# Patient Record
Sex: Male | Born: 1935 | Race: White | Hispanic: No | Marital: Married | Smoking: Never smoker
Health system: Southern US, Community
[De-identification: ages and names within clinical notes are randomized; demographics above are authoritative.]

## PROBLEM LIST (undated history)

## (undated) DIAGNOSIS — H9190 Unspecified hearing loss, unspecified ear: Secondary | ICD-10-CM

## (undated) DIAGNOSIS — G629 Polyneuropathy, unspecified: Secondary | ICD-10-CM

## (undated) HISTORY — PX: NECK SURGERY: SHX720

---

## 2011-11-03 DIAGNOSIS — M999 Biomechanical lesion, unspecified: Secondary | ICD-10-CM | POA: Diagnosis not present

## 2011-11-03 DIAGNOSIS — M9981 Other biomechanical lesions of cervical region: Secondary | ICD-10-CM | POA: Diagnosis not present

## 2011-11-03 DIAGNOSIS — M5030 Other cervical disc degeneration, unspecified cervical region: Secondary | ICD-10-CM | POA: Diagnosis not present

## 2011-11-03 DIAGNOSIS — M5137 Other intervertebral disc degeneration, lumbosacral region: Secondary | ICD-10-CM | POA: Diagnosis not present

## 2011-12-01 DIAGNOSIS — H25099 Other age-related incipient cataract, unspecified eye: Secondary | ICD-10-CM | POA: Diagnosis not present

## 2011-12-01 DIAGNOSIS — H1130 Conjunctival hemorrhage, unspecified eye: Secondary | ICD-10-CM | POA: Diagnosis not present

## 2011-12-01 DIAGNOSIS — H33309 Unspecified retinal break, unspecified eye: Secondary | ICD-10-CM | POA: Diagnosis not present

## 2011-12-01 DIAGNOSIS — H02409 Unspecified ptosis of unspecified eyelid: Secondary | ICD-10-CM | POA: Diagnosis not present

## 2011-12-08 DIAGNOSIS — M5137 Other intervertebral disc degeneration, lumbosacral region: Secondary | ICD-10-CM | POA: Diagnosis not present

## 2011-12-08 DIAGNOSIS — M5030 Other cervical disc degeneration, unspecified cervical region: Secondary | ICD-10-CM | POA: Diagnosis not present

## 2011-12-08 DIAGNOSIS — M999 Biomechanical lesion, unspecified: Secondary | ICD-10-CM | POA: Diagnosis not present

## 2011-12-08 DIAGNOSIS — M9981 Other biomechanical lesions of cervical region: Secondary | ICD-10-CM | POA: Diagnosis not present

## 2011-12-23 DIAGNOSIS — H35039 Hypertensive retinopathy, unspecified eye: Secondary | ICD-10-CM | POA: Diagnosis not present

## 2011-12-23 DIAGNOSIS — I10 Essential (primary) hypertension: Secondary | ICD-10-CM | POA: Diagnosis not present

## 2012-01-05 DIAGNOSIS — M999 Biomechanical lesion, unspecified: Secondary | ICD-10-CM | POA: Diagnosis not present

## 2012-01-05 DIAGNOSIS — M5030 Other cervical disc degeneration, unspecified cervical region: Secondary | ICD-10-CM | POA: Diagnosis not present

## 2012-01-05 DIAGNOSIS — M5137 Other intervertebral disc degeneration, lumbosacral region: Secondary | ICD-10-CM | POA: Diagnosis not present

## 2012-01-05 DIAGNOSIS — M9981 Other biomechanical lesions of cervical region: Secondary | ICD-10-CM | POA: Diagnosis not present

## 2012-01-10 DIAGNOSIS — J019 Acute sinusitis, unspecified: Secondary | ICD-10-CM | POA: Diagnosis not present

## 2012-02-02 DIAGNOSIS — M5137 Other intervertebral disc degeneration, lumbosacral region: Secondary | ICD-10-CM | POA: Diagnosis not present

## 2012-02-02 DIAGNOSIS — M999 Biomechanical lesion, unspecified: Secondary | ICD-10-CM | POA: Diagnosis not present

## 2012-02-02 DIAGNOSIS — M5030 Other cervical disc degeneration, unspecified cervical region: Secondary | ICD-10-CM | POA: Diagnosis not present

## 2012-02-02 DIAGNOSIS — M9981 Other biomechanical lesions of cervical region: Secondary | ICD-10-CM | POA: Diagnosis not present

## 2012-03-01 DIAGNOSIS — M999 Biomechanical lesion, unspecified: Secondary | ICD-10-CM | POA: Diagnosis not present

## 2012-03-01 DIAGNOSIS — M5137 Other intervertebral disc degeneration, lumbosacral region: Secondary | ICD-10-CM | POA: Diagnosis not present

## 2012-03-01 DIAGNOSIS — M9981 Other biomechanical lesions of cervical region: Secondary | ICD-10-CM | POA: Diagnosis not present

## 2012-03-01 DIAGNOSIS — M5030 Other cervical disc degeneration, unspecified cervical region: Secondary | ICD-10-CM | POA: Diagnosis not present

## 2012-03-07 DIAGNOSIS — M5030 Other cervical disc degeneration, unspecified cervical region: Secondary | ICD-10-CM | POA: Diagnosis not present

## 2012-03-07 DIAGNOSIS — M9981 Other biomechanical lesions of cervical region: Secondary | ICD-10-CM | POA: Diagnosis not present

## 2012-03-07 DIAGNOSIS — M999 Biomechanical lesion, unspecified: Secondary | ICD-10-CM | POA: Diagnosis not present

## 2012-03-07 DIAGNOSIS — M5137 Other intervertebral disc degeneration, lumbosacral region: Secondary | ICD-10-CM | POA: Diagnosis not present

## 2012-03-08 DIAGNOSIS — M999 Biomechanical lesion, unspecified: Secondary | ICD-10-CM | POA: Diagnosis not present

## 2012-03-08 DIAGNOSIS — M9981 Other biomechanical lesions of cervical region: Secondary | ICD-10-CM | POA: Diagnosis not present

## 2012-03-08 DIAGNOSIS — M5137 Other intervertebral disc degeneration, lumbosacral region: Secondary | ICD-10-CM | POA: Diagnosis not present

## 2012-03-08 DIAGNOSIS — M5030 Other cervical disc degeneration, unspecified cervical region: Secondary | ICD-10-CM | POA: Diagnosis not present

## 2012-03-22 DIAGNOSIS — M999 Biomechanical lesion, unspecified: Secondary | ICD-10-CM | POA: Diagnosis not present

## 2012-03-22 DIAGNOSIS — M5030 Other cervical disc degeneration, unspecified cervical region: Secondary | ICD-10-CM | POA: Diagnosis not present

## 2012-03-22 DIAGNOSIS — M5137 Other intervertebral disc degeneration, lumbosacral region: Secondary | ICD-10-CM | POA: Diagnosis not present

## 2012-03-22 DIAGNOSIS — M9981 Other biomechanical lesions of cervical region: Secondary | ICD-10-CM | POA: Diagnosis not present

## 2012-04-04 DIAGNOSIS — G609 Hereditary and idiopathic neuropathy, unspecified: Secondary | ICD-10-CM | POA: Diagnosis not present

## 2012-04-05 DIAGNOSIS — M9981 Other biomechanical lesions of cervical region: Secondary | ICD-10-CM | POA: Diagnosis not present

## 2012-04-05 DIAGNOSIS — M5030 Other cervical disc degeneration, unspecified cervical region: Secondary | ICD-10-CM | POA: Diagnosis not present

## 2012-04-05 DIAGNOSIS — M5137 Other intervertebral disc degeneration, lumbosacral region: Secondary | ICD-10-CM | POA: Diagnosis not present

## 2012-04-05 DIAGNOSIS — M999 Biomechanical lesion, unspecified: Secondary | ICD-10-CM | POA: Diagnosis not present

## 2012-04-11 DIAGNOSIS — J069 Acute upper respiratory infection, unspecified: Secondary | ICD-10-CM | POA: Diagnosis not present

## 2012-04-19 DIAGNOSIS — M5030 Other cervical disc degeneration, unspecified cervical region: Secondary | ICD-10-CM | POA: Diagnosis not present

## 2012-04-19 DIAGNOSIS — M9981 Other biomechanical lesions of cervical region: Secondary | ICD-10-CM | POA: Diagnosis not present

## 2012-04-19 DIAGNOSIS — M999 Biomechanical lesion, unspecified: Secondary | ICD-10-CM | POA: Diagnosis not present

## 2012-04-19 DIAGNOSIS — M5137 Other intervertebral disc degeneration, lumbosacral region: Secondary | ICD-10-CM | POA: Diagnosis not present

## 2012-05-10 DIAGNOSIS — G608 Other hereditary and idiopathic neuropathies: Secondary | ICD-10-CM | POA: Diagnosis not present

## 2012-05-14 DIAGNOSIS — Z79899 Other long term (current) drug therapy: Secondary | ICD-10-CM | POA: Diagnosis not present

## 2012-05-17 DIAGNOSIS — M9981 Other biomechanical lesions of cervical region: Secondary | ICD-10-CM | POA: Diagnosis not present

## 2012-05-17 DIAGNOSIS — M999 Biomechanical lesion, unspecified: Secondary | ICD-10-CM | POA: Diagnosis not present

## 2012-05-17 DIAGNOSIS — M5030 Other cervical disc degeneration, unspecified cervical region: Secondary | ICD-10-CM | POA: Diagnosis not present

## 2012-05-17 DIAGNOSIS — M5137 Other intervertebral disc degeneration, lumbosacral region: Secondary | ICD-10-CM | POA: Diagnosis not present

## 2012-05-18 DIAGNOSIS — T6391XA Toxic effect of contact with unspecified venomous animal, accidental (unintentional), initial encounter: Secondary | ICD-10-CM | POA: Diagnosis not present

## 2012-06-14 DIAGNOSIS — M999 Biomechanical lesion, unspecified: Secondary | ICD-10-CM | POA: Diagnosis not present

## 2012-06-14 DIAGNOSIS — M9981 Other biomechanical lesions of cervical region: Secondary | ICD-10-CM | POA: Diagnosis not present

## 2012-06-14 DIAGNOSIS — M5137 Other intervertebral disc degeneration, lumbosacral region: Secondary | ICD-10-CM | POA: Diagnosis not present

## 2012-06-14 DIAGNOSIS — M5030 Other cervical disc degeneration, unspecified cervical region: Secondary | ICD-10-CM | POA: Diagnosis not present

## 2012-06-18 DIAGNOSIS — G589 Mononeuropathy, unspecified: Secondary | ICD-10-CM | POA: Diagnosis not present

## 2012-06-27 DIAGNOSIS — G608 Other hereditary and idiopathic neuropathies: Secondary | ICD-10-CM | POA: Diagnosis not present

## 2012-07-10 DIAGNOSIS — G609 Hereditary and idiopathic neuropathy, unspecified: Secondary | ICD-10-CM | POA: Diagnosis not present

## 2012-07-12 DIAGNOSIS — M5030 Other cervical disc degeneration, unspecified cervical region: Secondary | ICD-10-CM | POA: Diagnosis not present

## 2012-07-12 DIAGNOSIS — M999 Biomechanical lesion, unspecified: Secondary | ICD-10-CM | POA: Diagnosis not present

## 2012-07-12 DIAGNOSIS — M5137 Other intervertebral disc degeneration, lumbosacral region: Secondary | ICD-10-CM | POA: Diagnosis not present

## 2012-07-12 DIAGNOSIS — M9981 Other biomechanical lesions of cervical region: Secondary | ICD-10-CM | POA: Diagnosis not present

## 2012-08-09 DIAGNOSIS — M5030 Other cervical disc degeneration, unspecified cervical region: Secondary | ICD-10-CM | POA: Diagnosis not present

## 2012-08-09 DIAGNOSIS — M5137 Other intervertebral disc degeneration, lumbosacral region: Secondary | ICD-10-CM | POA: Diagnosis not present

## 2012-08-09 DIAGNOSIS — M999 Biomechanical lesion, unspecified: Secondary | ICD-10-CM | POA: Diagnosis not present

## 2012-08-09 DIAGNOSIS — M9981 Other biomechanical lesions of cervical region: Secondary | ICD-10-CM | POA: Diagnosis not present

## 2012-08-27 DIAGNOSIS — G608 Other hereditary and idiopathic neuropathies: Secondary | ICD-10-CM | POA: Diagnosis not present

## 2012-10-01 DIAGNOSIS — M545 Low back pain: Secondary | ICD-10-CM | POA: Diagnosis not present

## 2012-10-30 DIAGNOSIS — H3560 Retinal hemorrhage, unspecified eye: Secondary | ICD-10-CM | POA: Diagnosis not present

## 2012-10-31 DIAGNOSIS — M47817 Spondylosis without myelopathy or radiculopathy, lumbosacral region: Secondary | ICD-10-CM | POA: Diagnosis not present

## 2012-11-14 DIAGNOSIS — M47817 Spondylosis without myelopathy or radiculopathy, lumbosacral region: Secondary | ICD-10-CM | POA: Diagnosis not present

## 2012-11-16 DIAGNOSIS — S61209A Unspecified open wound of unspecified finger without damage to nail, initial encounter: Secondary | ICD-10-CM | POA: Diagnosis not present

## 2012-11-16 DIAGNOSIS — T148XXA Other injury of unspecified body region, initial encounter: Secondary | ICD-10-CM | POA: Diagnosis not present

## 2013-01-15 DIAGNOSIS — M899 Disorder of bone, unspecified: Secondary | ICD-10-CM | POA: Diagnosis not present

## 2013-01-15 DIAGNOSIS — M949 Disorder of cartilage, unspecified: Secondary | ICD-10-CM | POA: Diagnosis not present

## 2013-01-15 DIAGNOSIS — M7500 Adhesive capsulitis of unspecified shoulder: Secondary | ICD-10-CM | POA: Diagnosis not present

## 2013-01-22 DIAGNOSIS — IMO0001 Reserved for inherently not codable concepts without codable children: Secondary | ICD-10-CM | POA: Diagnosis not present

## 2013-01-22 DIAGNOSIS — M7500 Adhesive capsulitis of unspecified shoulder: Secondary | ICD-10-CM | POA: Diagnosis not present

## 2013-01-28 DIAGNOSIS — Z800 Family history of malignant neoplasm of digestive organs: Secondary | ICD-10-CM | POA: Diagnosis not present

## 2013-01-28 DIAGNOSIS — Z86010 Personal history of colonic polyps: Secondary | ICD-10-CM | POA: Diagnosis not present

## 2013-01-30 DIAGNOSIS — Z79899 Other long term (current) drug therapy: Secondary | ICD-10-CM | POA: Diagnosis not present

## 2013-01-30 DIAGNOSIS — G589 Mononeuropathy, unspecified: Secondary | ICD-10-CM | POA: Diagnosis not present

## 2013-01-30 DIAGNOSIS — M545 Low back pain: Secondary | ICD-10-CM | POA: Diagnosis not present

## 2013-02-21 DIAGNOSIS — M7500 Adhesive capsulitis of unspecified shoulder: Secondary | ICD-10-CM | POA: Diagnosis not present

## 2013-02-21 DIAGNOSIS — IMO0001 Reserved for inherently not codable concepts without codable children: Secondary | ICD-10-CM | POA: Diagnosis not present

## 2013-07-31 DIAGNOSIS — L909 Atrophic disorder of skin, unspecified: Secondary | ICD-10-CM | POA: Diagnosis not present

## 2013-07-31 DIAGNOSIS — L821 Other seborrheic keratosis: Secondary | ICD-10-CM | POA: Diagnosis not present

## 2013-07-31 DIAGNOSIS — L820 Inflamed seborrheic keratosis: Secondary | ICD-10-CM | POA: Diagnosis not present

## 2013-11-21 DIAGNOSIS — R011 Cardiac murmur, unspecified: Secondary | ICD-10-CM | POA: Diagnosis not present

## 2013-11-21 DIAGNOSIS — R259 Unspecified abnormal involuntary movements: Secondary | ICD-10-CM | POA: Diagnosis not present

## 2013-11-21 DIAGNOSIS — G609 Hereditary and idiopathic neuropathy, unspecified: Secondary | ICD-10-CM | POA: Diagnosis not present

## 2013-11-21 DIAGNOSIS — R252 Cramp and spasm: Secondary | ICD-10-CM | POA: Diagnosis not present

## 2013-12-11 ENCOUNTER — Other Ambulatory Visit (HOSPITAL_COMMUNITY): Payer: Self-pay

## 2013-12-11 ENCOUNTER — Encounter (HOSPITAL_COMMUNITY): Payer: Self-pay | Admitting: Geriatric Medicine

## 2013-12-16 DIAGNOSIS — M5126 Other intervertebral disc displacement, lumbar region: Secondary | ICD-10-CM | POA: Diagnosis not present

## 2013-12-16 DIAGNOSIS — IMO0002 Reserved for concepts with insufficient information to code with codable children: Secondary | ICD-10-CM | POA: Diagnosis not present

## 2013-12-16 DIAGNOSIS — M999 Biomechanical lesion, unspecified: Secondary | ICD-10-CM | POA: Diagnosis not present

## 2013-12-16 DIAGNOSIS — G608 Other hereditary and idiopathic neuropathies: Secondary | ICD-10-CM | POA: Diagnosis not present

## 2013-12-16 DIAGNOSIS — M5124 Other intervertebral disc displacement, thoracic region: Secondary | ICD-10-CM | POA: Diagnosis not present

## 2013-12-18 DIAGNOSIS — M999 Biomechanical lesion, unspecified: Secondary | ICD-10-CM | POA: Diagnosis not present

## 2013-12-18 DIAGNOSIS — G608 Other hereditary and idiopathic neuropathies: Secondary | ICD-10-CM | POA: Diagnosis not present

## 2013-12-18 DIAGNOSIS — IMO0002 Reserved for concepts with insufficient information to code with codable children: Secondary | ICD-10-CM | POA: Diagnosis not present

## 2013-12-18 DIAGNOSIS — M5124 Other intervertebral disc displacement, thoracic region: Secondary | ICD-10-CM | POA: Diagnosis not present

## 2013-12-18 DIAGNOSIS — M5126 Other intervertebral disc displacement, lumbar region: Secondary | ICD-10-CM | POA: Diagnosis not present

## 2013-12-20 DIAGNOSIS — M5124 Other intervertebral disc displacement, thoracic region: Secondary | ICD-10-CM | POA: Diagnosis not present

## 2013-12-20 DIAGNOSIS — M5126 Other intervertebral disc displacement, lumbar region: Secondary | ICD-10-CM | POA: Diagnosis not present

## 2013-12-20 DIAGNOSIS — G608 Other hereditary and idiopathic neuropathies: Secondary | ICD-10-CM | POA: Diagnosis not present

## 2013-12-20 DIAGNOSIS — M999 Biomechanical lesion, unspecified: Secondary | ICD-10-CM | POA: Diagnosis not present

## 2013-12-20 DIAGNOSIS — IMO0002 Reserved for concepts with insufficient information to code with codable children: Secondary | ICD-10-CM | POA: Diagnosis not present

## 2013-12-23 DIAGNOSIS — M999 Biomechanical lesion, unspecified: Secondary | ICD-10-CM | POA: Diagnosis not present

## 2013-12-23 DIAGNOSIS — M5124 Other intervertebral disc displacement, thoracic region: Secondary | ICD-10-CM | POA: Diagnosis not present

## 2013-12-23 DIAGNOSIS — M5126 Other intervertebral disc displacement, lumbar region: Secondary | ICD-10-CM | POA: Diagnosis not present

## 2013-12-23 DIAGNOSIS — G608 Other hereditary and idiopathic neuropathies: Secondary | ICD-10-CM | POA: Diagnosis not present

## 2013-12-23 DIAGNOSIS — IMO0002 Reserved for concepts with insufficient information to code with codable children: Secondary | ICD-10-CM | POA: Diagnosis not present

## 2013-12-24 DIAGNOSIS — G608 Other hereditary and idiopathic neuropathies: Secondary | ICD-10-CM | POA: Diagnosis not present

## 2013-12-24 DIAGNOSIS — M5124 Other intervertebral disc displacement, thoracic region: Secondary | ICD-10-CM | POA: Diagnosis not present

## 2013-12-24 DIAGNOSIS — M999 Biomechanical lesion, unspecified: Secondary | ICD-10-CM | POA: Diagnosis not present

## 2013-12-24 DIAGNOSIS — M5126 Other intervertebral disc displacement, lumbar region: Secondary | ICD-10-CM | POA: Diagnosis not present

## 2013-12-24 DIAGNOSIS — IMO0002 Reserved for concepts with insufficient information to code with codable children: Secondary | ICD-10-CM | POA: Diagnosis not present

## 2013-12-25 DIAGNOSIS — M999 Biomechanical lesion, unspecified: Secondary | ICD-10-CM | POA: Diagnosis not present

## 2013-12-25 DIAGNOSIS — IMO0002 Reserved for concepts with insufficient information to code with codable children: Secondary | ICD-10-CM | POA: Diagnosis not present

## 2013-12-25 DIAGNOSIS — G608 Other hereditary and idiopathic neuropathies: Secondary | ICD-10-CM | POA: Diagnosis not present

## 2013-12-25 DIAGNOSIS — M5124 Other intervertebral disc displacement, thoracic region: Secondary | ICD-10-CM | POA: Diagnosis not present

## 2013-12-25 DIAGNOSIS — M5126 Other intervertebral disc displacement, lumbar region: Secondary | ICD-10-CM | POA: Diagnosis not present

## 2013-12-30 ENCOUNTER — Ambulatory Visit (HOSPITAL_COMMUNITY): Payer: Medicare Other | Attending: Cardiology | Admitting: Cardiology

## 2013-12-30 ENCOUNTER — Other Ambulatory Visit (HOSPITAL_COMMUNITY): Payer: Self-pay

## 2013-12-30 ENCOUNTER — Other Ambulatory Visit (HOSPITAL_COMMUNITY): Payer: Self-pay | Admitting: Cardiology

## 2013-12-30 DIAGNOSIS — R011 Cardiac murmur, unspecified: Secondary | ICD-10-CM

## 2013-12-30 DIAGNOSIS — M5124 Other intervertebral disc displacement, thoracic region: Secondary | ICD-10-CM | POA: Diagnosis not present

## 2013-12-30 DIAGNOSIS — I059 Rheumatic mitral valve disease, unspecified: Secondary | ICD-10-CM

## 2013-12-30 DIAGNOSIS — G608 Other hereditary and idiopathic neuropathies: Secondary | ICD-10-CM | POA: Diagnosis not present

## 2013-12-30 DIAGNOSIS — I359 Nonrheumatic aortic valve disorder, unspecified: Secondary | ICD-10-CM | POA: Diagnosis not present

## 2013-12-30 DIAGNOSIS — M5126 Other intervertebral disc displacement, lumbar region: Secondary | ICD-10-CM | POA: Diagnosis not present

## 2013-12-30 DIAGNOSIS — IMO0002 Reserved for concepts with insufficient information to code with codable children: Secondary | ICD-10-CM | POA: Diagnosis not present

## 2013-12-30 DIAGNOSIS — M999 Biomechanical lesion, unspecified: Secondary | ICD-10-CM | POA: Diagnosis not present

## 2013-12-30 NOTE — Progress Notes (Signed)
Echo performed. 

## 2013-12-31 DIAGNOSIS — M5124 Other intervertebral disc displacement, thoracic region: Secondary | ICD-10-CM | POA: Diagnosis not present

## 2013-12-31 DIAGNOSIS — IMO0002 Reserved for concepts with insufficient information to code with codable children: Secondary | ICD-10-CM | POA: Diagnosis not present

## 2013-12-31 DIAGNOSIS — M5126 Other intervertebral disc displacement, lumbar region: Secondary | ICD-10-CM | POA: Diagnosis not present

## 2013-12-31 DIAGNOSIS — M999 Biomechanical lesion, unspecified: Secondary | ICD-10-CM | POA: Diagnosis not present

## 2013-12-31 DIAGNOSIS — G608 Other hereditary and idiopathic neuropathies: Secondary | ICD-10-CM | POA: Diagnosis not present

## 2014-01-02 DIAGNOSIS — M5124 Other intervertebral disc displacement, thoracic region: Secondary | ICD-10-CM | POA: Diagnosis not present

## 2014-01-02 DIAGNOSIS — IMO0002 Reserved for concepts with insufficient information to code with codable children: Secondary | ICD-10-CM | POA: Diagnosis not present

## 2014-01-02 DIAGNOSIS — M999 Biomechanical lesion, unspecified: Secondary | ICD-10-CM | POA: Diagnosis not present

## 2014-01-02 DIAGNOSIS — G608 Other hereditary and idiopathic neuropathies: Secondary | ICD-10-CM | POA: Diagnosis not present

## 2014-01-02 DIAGNOSIS — M5126 Other intervertebral disc displacement, lumbar region: Secondary | ICD-10-CM | POA: Diagnosis not present

## 2014-01-06 DIAGNOSIS — IMO0002 Reserved for concepts with insufficient information to code with codable children: Secondary | ICD-10-CM | POA: Diagnosis not present

## 2014-01-06 DIAGNOSIS — M25559 Pain in unspecified hip: Secondary | ICD-10-CM | POA: Diagnosis not present

## 2014-01-06 DIAGNOSIS — M999 Biomechanical lesion, unspecified: Secondary | ICD-10-CM | POA: Diagnosis not present

## 2014-01-06 DIAGNOSIS — M5126 Other intervertebral disc displacement, lumbar region: Secondary | ICD-10-CM | POA: Diagnosis not present

## 2014-01-07 DIAGNOSIS — M999 Biomechanical lesion, unspecified: Secondary | ICD-10-CM | POA: Diagnosis not present

## 2014-01-07 DIAGNOSIS — M5126 Other intervertebral disc displacement, lumbar region: Secondary | ICD-10-CM | POA: Diagnosis not present

## 2014-01-07 DIAGNOSIS — M25559 Pain in unspecified hip: Secondary | ICD-10-CM | POA: Diagnosis not present

## 2014-01-07 DIAGNOSIS — IMO0002 Reserved for concepts with insufficient information to code with codable children: Secondary | ICD-10-CM | POA: Diagnosis not present

## 2014-01-09 DIAGNOSIS — M5126 Other intervertebral disc displacement, lumbar region: Secondary | ICD-10-CM | POA: Diagnosis not present

## 2014-01-09 DIAGNOSIS — M999 Biomechanical lesion, unspecified: Secondary | ICD-10-CM | POA: Diagnosis not present

## 2014-01-09 DIAGNOSIS — IMO0002 Reserved for concepts with insufficient information to code with codable children: Secondary | ICD-10-CM | POA: Diagnosis not present

## 2014-01-13 DIAGNOSIS — M999 Biomechanical lesion, unspecified: Secondary | ICD-10-CM | POA: Diagnosis not present

## 2014-01-13 DIAGNOSIS — M5126 Other intervertebral disc displacement, lumbar region: Secondary | ICD-10-CM | POA: Diagnosis not present

## 2014-01-13 DIAGNOSIS — IMO0002 Reserved for concepts with insufficient information to code with codable children: Secondary | ICD-10-CM | POA: Diagnosis not present

## 2014-01-13 DIAGNOSIS — M25559 Pain in unspecified hip: Secondary | ICD-10-CM | POA: Diagnosis not present

## 2014-01-16 DIAGNOSIS — M25559 Pain in unspecified hip: Secondary | ICD-10-CM | POA: Diagnosis not present

## 2014-01-16 DIAGNOSIS — IMO0002 Reserved for concepts with insufficient information to code with codable children: Secondary | ICD-10-CM | POA: Diagnosis not present

## 2014-01-16 DIAGNOSIS — M999 Biomechanical lesion, unspecified: Secondary | ICD-10-CM | POA: Diagnosis not present

## 2014-01-16 DIAGNOSIS — M5126 Other intervertebral disc displacement, lumbar region: Secondary | ICD-10-CM | POA: Diagnosis not present

## 2014-01-20 DIAGNOSIS — M25559 Pain in unspecified hip: Secondary | ICD-10-CM | POA: Diagnosis not present

## 2014-01-20 DIAGNOSIS — IMO0002 Reserved for concepts with insufficient information to code with codable children: Secondary | ICD-10-CM | POA: Diagnosis not present

## 2014-01-20 DIAGNOSIS — M999 Biomechanical lesion, unspecified: Secondary | ICD-10-CM | POA: Diagnosis not present

## 2014-01-20 DIAGNOSIS — M5126 Other intervertebral disc displacement, lumbar region: Secondary | ICD-10-CM | POA: Diagnosis not present

## 2014-01-23 DIAGNOSIS — M5126 Other intervertebral disc displacement, lumbar region: Secondary | ICD-10-CM | POA: Diagnosis not present

## 2014-01-23 DIAGNOSIS — M25559 Pain in unspecified hip: Secondary | ICD-10-CM | POA: Diagnosis not present

## 2014-01-23 DIAGNOSIS — IMO0002 Reserved for concepts with insufficient information to code with codable children: Secondary | ICD-10-CM | POA: Diagnosis not present

## 2014-01-23 DIAGNOSIS — M999 Biomechanical lesion, unspecified: Secondary | ICD-10-CM | POA: Diagnosis not present

## 2014-01-27 DIAGNOSIS — M5126 Other intervertebral disc displacement, lumbar region: Secondary | ICD-10-CM | POA: Diagnosis not present

## 2014-01-27 DIAGNOSIS — M999 Biomechanical lesion, unspecified: Secondary | ICD-10-CM | POA: Diagnosis not present

## 2014-01-27 DIAGNOSIS — M25559 Pain in unspecified hip: Secondary | ICD-10-CM | POA: Diagnosis not present

## 2014-01-27 DIAGNOSIS — IMO0002 Reserved for concepts with insufficient information to code with codable children: Secondary | ICD-10-CM | POA: Diagnosis not present

## 2014-01-29 DIAGNOSIS — H2510 Age-related nuclear cataract, unspecified eye: Secondary | ICD-10-CM | POA: Diagnosis not present

## 2014-01-30 DIAGNOSIS — M5126 Other intervertebral disc displacement, lumbar region: Secondary | ICD-10-CM | POA: Diagnosis not present

## 2014-01-30 DIAGNOSIS — M999 Biomechanical lesion, unspecified: Secondary | ICD-10-CM | POA: Diagnosis not present

## 2014-01-30 DIAGNOSIS — IMO0002 Reserved for concepts with insufficient information to code with codable children: Secondary | ICD-10-CM | POA: Diagnosis not present

## 2014-01-30 DIAGNOSIS — M25559 Pain in unspecified hip: Secondary | ICD-10-CM | POA: Diagnosis not present

## 2014-02-06 DIAGNOSIS — M999 Biomechanical lesion, unspecified: Secondary | ICD-10-CM | POA: Diagnosis not present

## 2014-02-06 DIAGNOSIS — M25559 Pain in unspecified hip: Secondary | ICD-10-CM | POA: Diagnosis not present

## 2014-02-06 DIAGNOSIS — M5126 Other intervertebral disc displacement, lumbar region: Secondary | ICD-10-CM | POA: Diagnosis not present

## 2014-02-06 DIAGNOSIS — IMO0002 Reserved for concepts with insufficient information to code with codable children: Secondary | ICD-10-CM | POA: Diagnosis not present

## 2014-02-13 DIAGNOSIS — M25559 Pain in unspecified hip: Secondary | ICD-10-CM | POA: Diagnosis not present

## 2014-02-13 DIAGNOSIS — M5126 Other intervertebral disc displacement, lumbar region: Secondary | ICD-10-CM | POA: Diagnosis not present

## 2014-02-13 DIAGNOSIS — M999 Biomechanical lesion, unspecified: Secondary | ICD-10-CM | POA: Diagnosis not present

## 2014-02-13 DIAGNOSIS — IMO0002 Reserved for concepts with insufficient information to code with codable children: Secondary | ICD-10-CM | POA: Diagnosis not present

## 2014-05-15 DIAGNOSIS — L989 Disorder of the skin and subcutaneous tissue, unspecified: Secondary | ICD-10-CM | POA: Diagnosis not present

## 2014-05-15 DIAGNOSIS — Z23 Encounter for immunization: Secondary | ICD-10-CM | POA: Diagnosis not present

## 2014-05-15 DIAGNOSIS — R252 Cramp and spasm: Secondary | ICD-10-CM | POA: Diagnosis not present

## 2014-05-15 DIAGNOSIS — Z0000 Encounter for general adult medical examination without abnormal findings: Secondary | ICD-10-CM | POA: Diagnosis not present

## 2014-05-15 DIAGNOSIS — Z1331 Encounter for screening for depression: Secondary | ICD-10-CM | POA: Diagnosis not present

## 2014-05-15 DIAGNOSIS — Z79899 Other long term (current) drug therapy: Secondary | ICD-10-CM | POA: Diagnosis not present

## 2014-07-18 DIAGNOSIS — J069 Acute upper respiratory infection, unspecified: Secondary | ICD-10-CM | POA: Diagnosis not present

## 2014-07-18 DIAGNOSIS — I499 Cardiac arrhythmia, unspecified: Secondary | ICD-10-CM | POA: Diagnosis not present

## 2014-08-20 DIAGNOSIS — I340 Nonrheumatic mitral (valve) insufficiency: Secondary | ICD-10-CM | POA: Diagnosis not present

## 2014-08-20 DIAGNOSIS — M25561 Pain in right knee: Secondary | ICD-10-CM | POA: Diagnosis not present

## 2014-08-20 DIAGNOSIS — R600 Localized edema: Secondary | ICD-10-CM | POA: Diagnosis not present

## 2014-11-11 ENCOUNTER — Ambulatory Visit
Admission: RE | Admit: 2014-11-11 | Discharge: 2014-11-11 | Disposition: A | Discharge status: Home / Self Care | Payer: Medicare Other | Source: Ambulatory Visit | Attending: Internal Medicine | Admitting: Internal Medicine

## 2014-11-11 ENCOUNTER — Other Ambulatory Visit: Payer: Self-pay | Admitting: Internal Medicine

## 2014-11-11 DIAGNOSIS — S92912A Unspecified fracture of left toe(s), initial encounter for closed fracture: Secondary | ICD-10-CM | POA: Diagnosis not present

## 2014-11-11 DIAGNOSIS — S90122A Contusion of left lesser toe(s) without damage to nail, initial encounter: Secondary | ICD-10-CM | POA: Diagnosis not present

## 2014-11-11 DIAGNOSIS — S92512A Displaced fracture of proximal phalanx of left lesser toe(s), initial encounter for closed fracture: Secondary | ICD-10-CM | POA: Diagnosis not present

## 2014-11-11 DIAGNOSIS — S92515A Nondisplaced fracture of proximal phalanx of left lesser toe(s), initial encounter for closed fracture: Secondary | ICD-10-CM | POA: Diagnosis not present

## 2015-02-25 DIAGNOSIS — H2513 Age-related nuclear cataract, bilateral: Secondary | ICD-10-CM | POA: Diagnosis not present

## 2015-02-25 DIAGNOSIS — H02403 Unspecified ptosis of bilateral eyelids: Secondary | ICD-10-CM | POA: Diagnosis not present

## 2015-05-21 DIAGNOSIS — Z0000 Encounter for general adult medical examination without abnormal findings: Secondary | ICD-10-CM | POA: Diagnosis not present

## 2015-05-21 DIAGNOSIS — M25511 Pain in right shoulder: Secondary | ICD-10-CM | POA: Diagnosis not present

## 2015-05-21 DIAGNOSIS — Z79899 Other long term (current) drug therapy: Secondary | ICD-10-CM | POA: Diagnosis not present

## 2015-05-21 DIAGNOSIS — Z1389 Encounter for screening for other disorder: Secondary | ICD-10-CM | POA: Diagnosis not present

## 2015-05-21 DIAGNOSIS — Z23 Encounter for immunization: Secondary | ICD-10-CM | POA: Diagnosis not present

## 2015-11-27 DIAGNOSIS — J209 Acute bronchitis, unspecified: Secondary | ICD-10-CM | POA: Diagnosis not present

## 2015-11-30 DIAGNOSIS — J209 Acute bronchitis, unspecified: Secondary | ICD-10-CM | POA: Diagnosis not present

## 2015-12-02 ENCOUNTER — Encounter (HOSPITAL_COMMUNITY): Payer: Self-pay

## 2015-12-02 ENCOUNTER — Emergency Department (HOSPITAL_COMMUNITY): Payer: Medicare Other

## 2015-12-02 ENCOUNTER — Emergency Department (HOSPITAL_COMMUNITY)
Admission: EM | Admit: 2015-12-02 | Discharge: 2015-12-02 | Disposition: A | Discharge status: Home / Self Care | Payer: Medicare Other | Attending: Emergency Medicine | Admitting: Emergency Medicine

## 2015-12-02 DIAGNOSIS — R05 Cough: Secondary | ICD-10-CM

## 2015-12-02 DIAGNOSIS — I4510 Unspecified right bundle-branch block: Secondary | ICD-10-CM

## 2015-12-02 DIAGNOSIS — R110 Nausea: Secondary | ICD-10-CM | POA: Diagnosis not present

## 2015-12-02 DIAGNOSIS — I491 Atrial premature depolarization: Secondary | ICD-10-CM | POA: Insufficient documentation

## 2015-12-02 DIAGNOSIS — R059 Cough, unspecified: Secondary | ICD-10-CM

## 2015-12-02 DIAGNOSIS — J069 Acute upper respiratory infection, unspecified: Secondary | ICD-10-CM | POA: Diagnosis not present

## 2015-12-02 DIAGNOSIS — H9190 Unspecified hearing loss, unspecified ear: Secondary | ICD-10-CM | POA: Diagnosis not present

## 2015-12-02 DIAGNOSIS — R1030 Lower abdominal pain, unspecified: Secondary | ICD-10-CM | POA: Diagnosis not present

## 2015-12-02 HISTORY — DX: Unspecified hearing loss, unspecified ear: H91.90

## 2015-12-02 HISTORY — DX: Polyneuropathy, unspecified: G62.9

## 2015-12-02 LAB — COMPREHENSIVE METABOLIC PANEL
ALBUMIN: 3.9 g/dL (ref 3.5–5.0)
ALK PHOS: 87 U/L (ref 38–126)
ALT: 33 U/L (ref 17–63)
AST: 31 U/L (ref 15–41)
Anion gap: 10 (ref 5–15)
BILIRUBIN TOTAL: 1.5 mg/dL — AB (ref 0.3–1.2)
BUN: 15 mg/dL (ref 6–20)
CO2: 23 mmol/L (ref 22–32)
CREATININE: 0.77 mg/dL (ref 0.61–1.24)
Calcium: 8.9 mg/dL (ref 8.9–10.3)
Chloride: 104 mmol/L (ref 101–111)
GFR calc Af Amer: >60 mL/min (ref >60)
GLUCOSE: 135 mg/dL — AB (ref 65–99)
POTASSIUM: 4.2 mmol/L (ref 3.5–5.1)
Sodium: 137 mmol/L (ref 135–145)
TOTAL PROTEIN: 6.7 g/dL (ref 6.5–8.1)

## 2015-12-02 LAB — CBC WITH DIFFERENTIAL/PLATELET
BASOS ABS: 0 10*3/uL (ref 0.0–0.1)
Basophils Relative: 0 %
EOS ABS: 0 10*3/uL (ref 0.0–0.7)
EOS PCT: 0 %
HEMATOCRIT: 46.7 % (ref 39.0–52.0)
HEMOGLOBIN: 15.8 g/dL (ref 13.0–17.0)
LYMPHS ABS: 0.8 10*3/uL (ref 0.7–4.0)
Lymphocytes Relative: 15 %
MCH: 30.7 pg (ref 26.0–34.0)
MCHC: 33.8 g/dL (ref 30.0–36.0)
MCV: 90.7 fL (ref 78.0–100.0)
MONOS PCT: 10 %
Monocytes Absolute: 0.5 10*3/uL (ref 0.1–1.0)
Neutro Abs: 4 10*3/uL (ref 1.7–7.7)
Neutrophils Relative %: 75 %
Platelets: 152 10*3/uL (ref 150–400)
RBC: 5.15 MIL/uL (ref 4.22–5.81)
RDW: 13.1 % (ref 11.5–15.5)
WBC: 5.3 10*3/uL (ref 4.0–10.5)

## 2015-12-02 LAB — I-STAT TROPONIN, ED: Troponin i, poc: 0.02 ng/mL (ref 0.00–0.08)

## 2015-12-02 LAB — LIPASE, BLOOD: LIPASE: 23 U/L (ref 11–51)

## 2015-12-02 MED ORDER — ONDANSETRON 8 MG PO TBDP: 8.0000 mg | ORAL_TABLET | Freq: Three times a day (TID) | ORAL | Status: AC | PRN

## 2015-12-02 MED ORDER — ALBUTEROL SULFATE (2.5 MG/3ML) 0.083% IN NEBU
5.0000 mg | INHALATION_SOLUTION | Freq: Once | RESPIRATORY_TRACT | Status: AC
  Administered 2015-12-02: 5 mg via RESPIRATORY_TRACT
  Filled 2015-12-02: qty 6

## 2015-12-02 MED ORDER — ONDANSETRON 8 MG PO TBDP: 8.0000 mg | ORAL_TABLET | Freq: Once | ORAL | Status: DC

## 2015-12-02 MED ORDER — ALBUTEROL SULFATE HFA 108 (90 BASE) MCG/ACT IN AERS: 1.0000 | INHALATION_SPRAY | RESPIRATORY_TRACT | Status: AC | PRN

## 2015-12-02 MED ORDER — IPRATROPIUM BROMIDE 0.02 % IN SOLN
0.5000 mg | Freq: Once | RESPIRATORY_TRACT | Status: AC
  Administered 2015-12-02: 0.5 mg via RESPIRATORY_TRACT
  Filled 2015-12-02: qty 2.5

## 2015-12-02 MED ORDER — BENZONATATE 200 MG PO CAPS
200.0000 mg | ORAL_CAPSULE | Freq: Three times a day (TID) | ORAL | Status: AC | PRN
Start: 2015-12-02 — End: ?

## 2015-12-02 NOTE — ED Notes (Signed)
Pt's wife reports pt has had a cough x 3 weeks.  Has been to see his PVP [facility] x 3 and was given cough medicine without relief.  Pt is in NAD.  Denies FEVER

## 2015-12-02 NOTE — ED Provider Notes (Signed)
CSN: [id number]     Arrival date & time 12/02/15  1038 History   First MD Initiated Contact with Patient 12/02/15 1127     Chief Complaint  Patient presents with  . Cough     (Consider location/radiation/quality/duration/timing/severity/associated sxs/prior Treatment) HPI Comments: Charles Alvarez is a 82 y.o. male with a PMHx of hard of hearing and neuropathy, who presents to the ED via EMS from [facility] facility, with complaints of ongoing cough 2 weeks. Patient states that the cough is productive of clear sputum with occasional reddish brownish material mixed in, he was prescribed doxycycline and a cough syrup with codeine but this has not improved his symptoms. He was also prescribed prednisone but he did not take this because he does not want to gain weight. Associated symptoms include wheezing and green rhinorrhea. Doesn't sick contacts at home. No known aggravating factors. He states that he gets nauseous every time he takes the cough syrup, but this morning he states it "tore his stomach up" causing him to get very nauseated and he made several attempts to vomit, causing him to "poor out in sweat" while trying to vomit. He states he did get his flu shot. He is a nonsmoker.  He denies any fevers, chills, chest pain, shortness of breath, ear pain or drainage, sore throat, abdominal pain, vomiting, diarrhea, dysuria, hematuria, leg swelling, numbness, tingling, weakness, or lightheadedness. He denies any recent travel/surgery/immobilization, or history of DVT/PE. His PCP is Dr. Pete Glatter.   Patient is a 82 y.o. male presenting with cough. The history is provided by the patient. No language interpreter was used.  Cough Cough characteristics:  Productive Sputum characteristics:  Clear Severity:  Moderate Onset quality:  Gradual Duration:  2 weeks Timing:  Constant Progression:  Unchanged Chronicity:  New Smoker: no   Context: sick contacts and upper respiratory  infection   Relieved by:  Nothing Worsened by:  Nothing tried Ineffective treatments:  Cough suppressants Associated symptoms: diaphoresis (broke into sweat while trying to vomit), rhinorrhea and wheezing   Associated symptoms: no chest pain, no chills, no ear pain, no fever, no myalgias, no shortness of breath and no sore throat   Risk factors: no recent travel     Past Medical History  Diagnosis Date  . Neuropathy (HCC)   . HOH (hard of hearing)    Past Surgical History  Procedure Laterality Date  . Neck surgery     Family History  Problem Relation Age of Onset  . Family history unknown: Yes   Social History  Substance Use Topics  . Smoking status: Never Smoker   . Smokeless tobacco: Never Used  . Alcohol Use: No    Review of Systems  Constitutional: Positive for diaphoresis (broke into sweat while trying to vomit). Negative for fever and chills.  HENT: Positive for rhinorrhea. Negative for ear discharge, ear pain and sore throat.   Respiratory: Positive for cough and wheezing. Negative for shortness of breath.   Cardiovascular: Negative for chest pain and leg swelling.  Gastrointestinal: Positive for nausea. Negative for vomiting, abdominal pain and diarrhea.  Genitourinary: Negative for dysuria and hematuria.  Musculoskeletal: Negative for myalgias and arthralgias.  Skin: Negative for color change.  Allergic/Immunologic: Negative for immunocompromised state.  Neurological: Negative for weakness, light-headedness and numbness.  Psychiatric/Behavioral: Negative for confusion.   10 Systems reviewed and are negative for acute change except as noted in the HPI.    Allergies  Zyloprim  Home Medications   Prior to  Admission medications   Not on File   BP 159/72 mmHg  Pulse 65  Temp(Src) 98.1 F (36.7 C) (Oral)  Resp 20  SpO2 96% Physical Exam  Constitutional: He is oriented to person, place, and time. Vital signs are normal. He appears well-developed and  well-nourished.  Non-toxic appearance. No distress.  Afebrile, nontoxic, NAD  HENT:  Head: Normocephalic and atraumatic.  Nose: Mucosal edema and rhinorrhea present.  Mouth/Throat: Uvula is midline, oropharynx is clear and moist and mucous membranes are normal. No trismus in the jaw. No uvula swelling.  Nose with mucosal edema and rhinorrhea. Oropharynx clear and moist, without uvular swelling or deviation, no trismus or drooling, no tonsillar swelling or erythema, no exudates.    Eyes: Conjunctivae and EOM are normal. Right eye exhibits no discharge. Left eye exhibits no discharge.  Neck: Normal range of motion. Neck supple.  Cardiovascular: Normal rate, regular rhythm, normal heart sounds and intact distal pulses.  Exam reveals no gallop and no friction rub.   No murmur heard. RRR, nl s1/s2, no m/r/g, distal pulses intact, no pedal edema   Pulmonary/Chest: Effort normal. No respiratory distress. He has no decreased breath sounds. He has no wheezes. He has rhonchi in the left lower field. He has no rales.  Course sounds in the LLF with no wheezing or rales, no hypoxia or increased WOB, speaking in full sentences, SpO2 96% on RA   Abdominal: Soft. Normal appearance and bowel sounds are normal. He exhibits no distension. There is no tenderness. There is no rigidity, no rebound, no guarding, no CVA tenderness, no tenderness at McBurney's point and negative Murphy's sign.  Musculoskeletal: Normal range of motion.  MAE x4 Strength and sensation grossly intact Distal pulses intact No pedal edema  Neurological: He is alert and oriented to person, place, and time. He has normal strength. No sensory deficit.  Skin: Skin is warm, dry and intact. No rash noted.  Psychiatric: He has a normal mood and affect.  Nursing note and vitals reviewed.   ED Course  Procedures (including critical care time) Labs Review Labs Reviewed  COMPREHENSIVE METABOLIC PANEL - Abnormal; Notable for the following:     Glucose, Bld 135 (*)    Total Bilirubin 1.5 (*)    All other components within normal limits  CBC WITH DIFFERENTIAL/PLATELET  LIPASE, BLOOD  I-STAT TROPOININ, ED    Imaging Review Dg Chest 2 View  12/02/2015  CLINICAL DATA:  Pt states cough x 3 weeks - never a smoker - pt states no other chest hx EXAM: CHEST  2 VIEW COMPARISON:  None. FINDINGS: Normal mediastinum and cardiac silhouette. Normal pulmonary vasculature. No evidence of effusion, infiltrate, or pneumothorax. No acute bony abnormality. Degenerative osteophytosis of the thoracic spine. IMPRESSION: No acute cardiopulmonary process. Electronically Signed   By: Genevive Bi M.D.   On: 12/02/2015 12:25     Echo 12/30/13: Study Conclusions - Left ventricle: The cavity size was normal. Wall thickness was normal. Systolic function was normal. The estimated ejection fraction was in the range of 60% to 65%. Wall motion was normal; there were no regional wall motion abnormalities. Doppler parameters are consistent with abnormal left ventricular relaxation (grade 1 diastolic dysfunction). - Aortic valve: Poorly visualized. Probably trileaflet; moderately calcified leaflets. There was no stenosis. Mild to moderate regurgitation. - Aorta: Mildly dilated aortic root and ascending aorta. Aortic root dimension: 38mm (ED). Ascending aortic diameter: 39mm (S). - Mitral valve: Moderately calcified annulus. Trivial regurgitation. - Left atrium: The atrium  was mildly dilated. - Right ventricle: The cavity size was normal. Systolic function was normal. - Tricuspid valve: Peak RV-RA gradient: 26mm Hg (S). - Systemic veins: IVC was not visualized.  Impressions: - Normal LV size and systolic function, EF 60-65%. Normal RV size and systolic function. There was mild to moderate eccentric aortic insufficiency.  I have personally reviewed and evaluated these images and lab results as part of my medical  decision-making.   EKG Interpretation   Date/Time:  Wednesday December 02 2015 11:58:29 EST Ventricular Rate:  62 PR Interval:  156 QRS Duration: 155 QT Interval:  452 QTC Calculation: 459 R Axis:   -3 Text Interpretation:  Sinus rhythm Atrial premature complexes Right bundle  branch block No old tracing to compare Confirmed by FLOYD MD, DANIEL  [phone]) on 12/02/2015 12:23:47 PM      MDM   Final diagnoses:  Cough  URI (upper respiratory infection)  Nausea  Right bundle branch block  Premature atrial complexes    82 y.o. male here with ongoing cough x2 wks, no improvement with doxycycline or cough syrup. States that the cough syrup makes his nauseated, and today he was trying to vomit so hard that he got very sweaty. +Green rhinorrhea as well. On exam, LLL with course lung sounds, no wheezing but will give breathing tx to see if we can improve this. No tachycardia or hypoxia, no CP/SOB complaint, doubt PE. Likely just viral illness, but will get CXR and basic labs to ensure no other etiology. Will with zofran and reassess shortly.   1:06 PM Trop neg, EKG with PACs and RBBB but no prior to compare to. CBC w/diff unremarkable, lipase WNL, CMP with gluc 135 and bili 1.5 but unclear if this is acute or chronic, doubt this is significant at this time. CXR clear. Lung sounds improved after nebs, and pt states he feels "looser" in his chest, feels better. No ongoing nausea, tolerating PO well. Discussed that nausea was likely just from cough syrup, discussed discontinuing this and starting tessalon perles PRN for cough. Continue doxycycline (1 day left). Use zofran PRN nausea. Stay hydrated. Rx for inhaler given since this helped here. Discussed other OTC meds for symptoms, but discussed that it's likely a viral URI that needs to run its course. F/up with PCP in 1wk. My attending Dr. Adela Lank saw pt and agreed with this plan. I explained the diagnosis and have given explicit precautions to return  to the ER including for any other new or worsening symptoms. The patient understands and accepts the medical plan as it's been dictated and I have answered their questions. Discharge instructions concerning home care and prescriptions have been given. The patient is STABLE and is discharged to home in good condition.    BP 159/72 mmHg  Pulse 65  Temp(Src) 98.1 F (36.7 C) (Oral)  Resp 20  SpO2 98%  Meds ordered this encounter  Medications  . albuterol (PROVENTIL) (2.5 MG/3ML) 0.083% nebulizer solution 5 mg    Sig:   . ipratropium (ATROVENT) nebulizer solution 0.5 mg    Sig:   . ondansetron (ZOFRAN-ODT) disintegrating tablet 8 mg    Sig:   . benzonatate (TESSALON) 200 MG capsule    Sig: Take 1 capsule (200 mg total) by mouth 3 (three) times daily as needed for cough.    Dispense:  15 capsule    Refill:  0    Order Specific Question:  Supervising Provider    Answer:  MILLER, BRIAN [[id number]]  .  ondansetron (ZOFRAN ODT) 8 MG disintegrating tablet    Sig: Take 1 tablet (8 mg total) by mouth every 8 (eight) hours as needed for nausea or vomiting.    Dispense:  10 tablet    Refill:  0    Order Specific Question:  Supervising Provider    Answer:  Hyacinth Meeker, BRIAN [[id number]]  . albuterol (PROVENTIL HFA;VENTOLIN HFA) 108 (90 Base) MCG/ACT inhaler    Sig: Inhale 1-2 puffs into the lungs every 4 (four) hours as needed for wheezing or shortness of breath (or cough/chest congestion).    Dispense:  1 Inhaler    Refill:  0    Order Specific Question:  Supervising Provider    Answer:  Eber Hong [[id number]]      Avaleen Brownley Camprubi-Soms, PA-C 12/02/15 1309  Melene Plan, DO 12/02/15 1518

## 2015-12-02 NOTE — ED Notes (Signed)
Per EMS- Patient is a resident of [facility]. Patient has been having a dry non- productive cough x 2 weeks. Patient was prescribed cough syrup with codeine, doxycycline, and Prednisone . Patient did not take his Prednisone because he did not want to gain weight.

## 2015-12-02 NOTE — Discharge Instructions (Signed)
Continue to stay well-hydrated. Gargle warm salt water and spit it out. Continue to alternate between Tylenol and Ibuprofen for pain or fever. Use Mucinex for cough suppression/expectoration of mucus. Use netipot and flonase to help with nasal congestion. May consider over-the-counter Benadryl or other antihistamine to decrease secretions and for watery itchy eyes. Use inhaler as directed, as needed for cough/chest congestion. Use Tesslon pearls for cough suppression, stop using the cough syrup that's making you nauseated. Continue your antibiotics as directed. Use zofran as needed for nausea. Followup with your primary care doctor in 5-7 days for recheck of ongoing symptoms. Return to emergency department for emergent changing or worsening of symptoms.   Cough, Adult A cough helps to clear your throat and lungs. A cough may last only 2-3 weeks (acute), or it may last longer than 8 weeks (chronic). Many different things can cause a cough. A cough may be a sign of an illness or another medical condition. HOME CARE  Pay attention to any changes in your cough.  Take medicines only as told by your doctor.  If you were prescribed an antibiotic medicine, take it as told by your doctor. Do not stop taking it even if you start to feel better.  Talk with your doctor before you try using a cough medicine.  Drink enough fluid to keep your pee (urine) clear or pale yellow.  If the air is dry, use a cold steam vaporizer or humidifier in your home.  Stay away from things that make you cough at work or at home.  If your cough is worse at night, try using extra pillows to raise your head up higher while you sleep.  Do not smoke, and try not to be around smoke. If you need help quitting, ask your doctor.  Do not have caffeine.  Do not drink alcohol.  Rest as needed. GET HELP IF:  You have new problems (symptoms).  You cough up yellow fluid (pus).  Your cough does not get better after 2-3 weeks, or  your cough gets worse.  Medicine does not help your cough and you are not sleeping well.  You have pain that gets worse or pain that is not helped with medicine.  You have a fever.  You are losing weight and you do not know why.  You have night sweats. GET HELP RIGHT AWAY IF:  You cough up blood.  You have trouble breathing.  Your heartbeat is very fast.   This information is not intended to replace advice given to you by your health care provider. Make sure you discuss any questions you have with your health care provider.   Document Released: 06/23/2011 Document Revised: 07/01/2015 Document Reviewed: 12/17/2014 Elsevier Interactive Patient Education 2016 Elsevier Inc.  Viral Infections A viral infection can be caused by different types of viruses.Most viral infections are not serious and resolve on their own. However, some infections may cause severe symptoms and may lead to further complications. SYMPTOMS Viruses can frequently cause:  Minor sore throat.  Aches and pains.  Headaches.  Runny nose.  Different types of rashes.  Watery eyes.  Tiredness.  Cough.  Loss of appetite.  Gastrointestinal infections, resulting in nausea, vomiting, and diarrhea. These symptoms do not respond to antibiotics because the infection is not caused by bacteria. However, you might catch a bacterial infection following the viral infection. This is sometimes called a "superinfection." Symptoms of such a bacterial infection may include:  Worsening sore throat with pus and difficulty swallowing.  Swollen neck glands.  Chills and a high or persistent fever.  Severe headache.  Tenderness over the sinuses.  Persistent overall ill feeling (malaise), muscle aches, and tiredness (fatigue).  Persistent cough.  Yellow, green, or brown mucus production with coughing. HOME CARE INSTRUCTIONS   Only take over-the-counter or prescription medicines for pain, discomfort, diarrhea, or  fever as directed by your caregiver.  Drink enough water and fluids to keep your urine clear or pale yellow. Sports drinks can provide valuable electrolytes, sugars, and hydration.  Get plenty of rest and maintain proper nutrition. Soups and broths with crackers or rice are fine. SEEK IMMEDIATE MEDICAL CARE IF:   You have severe headaches, shortness of breath, chest pain, neck pain, or an unusual rash.  You have uncontrolled vomiting, diarrhea, or you are unable to keep down fluids.  You or your child has an oral temperature above 102 F (38.9 C), not controlled by medicine.  Your baby is older than 3 months with a rectal temperature of 102 F (38.9 C) or higher.  Your baby is 79 months old or younger with a rectal temperature of 100.4 F (38 C) or higher. MAKE SURE YOU:   Understand these instructions.  Will watch your condition.  Will get help right away if you are not doing well or get worse.   This information is not intended to replace advice given to you by your health care provider. Make sure you discuss any questions you have with your health care provider.   Document Released: 07/20/2005 Document Revised: 01/02/2012 Document Reviewed: 03/18/2015 Elsevier Interactive Patient Education 2016 Elsevier Inc.  Nausea, Adult Nausea means you feel sick to your stomach or need to throw up (vomit). It may be a sign of a more serious problem. If nausea gets worse, you may throw up. If you throw up a lot, you may lose too much body fluid (dehydration). HOME CARE   Get plenty of rest.  Ask your doctor how to replace body fluid losses (rehydrate).  Eat small amounts of food. Sip liquids more often.  Take all medicines as told by your doctor. GET HELP RIGHT AWAY IF:  You have a fever.  You pass out (faint).  You keep throwing up or have blood in your throw up.  You are very weak, have dry lips or a dry mouth, or you are very thirsty (dehydrated).  You have dark or bloody  poop (stool).  You have very bad chest or belly (abdominal) pain.  You do not get better after 2 days, or you get worse.  You have a headache. MAKE SURE YOU:  Understand these instructions.  Will watch your condition.  Will get help right away if you are not doing well or get worse.   This information is not intended to replace advice given to you by your health care provider. Make sure you discuss any questions you have with your health care provider.   Document Released: 09/29/2011 Document Revised: 01/02/2012 Document Reviewed: 09/29/2011 Elsevier Interactive Patient Education [employer]

## 2015-12-02 NOTE — ED Notes (Signed)
RT contacted to start breathing tx 

## 2015-12-02 NOTE — ED Notes (Signed)
Patient transported to X-ray 

## 2016-05-30 DIAGNOSIS — Z0000 Encounter for general adult medical examination without abnormal findings: Secondary | ICD-10-CM | POA: Diagnosis not present

## 2016-05-30 DIAGNOSIS — Z1389 Encounter for screening for other disorder: Secondary | ICD-10-CM | POA: Diagnosis not present

## 2016-05-30 DIAGNOSIS — Z6828 Body mass index (BMI) 28.0-28.9, adult: Secondary | ICD-10-CM | POA: Diagnosis not present

## 2016-05-30 DIAGNOSIS — I351 Nonrheumatic aortic (valve) insufficiency: Secondary | ICD-10-CM | POA: Diagnosis not present

## 2016-05-30 DIAGNOSIS — Z79899 Other long term (current) drug therapy: Secondary | ICD-10-CM | POA: Diagnosis not present

## 2016-05-31 ENCOUNTER — Other Ambulatory Visit: Payer: Self-pay | Admitting: Geriatric Medicine

## 2016-05-31 DIAGNOSIS — I351 Nonrheumatic aortic (valve) insufficiency: Secondary | ICD-10-CM

## 2016-06-08 ENCOUNTER — Other Ambulatory Visit (HOSPITAL_COMMUNITY): Payer: Medicare Other

## 2016-06-09 ENCOUNTER — Encounter (HOSPITAL_COMMUNITY): Payer: Self-pay | Admitting: Radiology

## 2016-06-09 NOTE — Progress Notes (Signed)
Left message regarding echo appt and no show fee.

## 2016-06-13 ENCOUNTER — Other Ambulatory Visit: Payer: Self-pay

## 2016-06-13 ENCOUNTER — Ambulatory Visit (HOSPITAL_COMMUNITY): Payer: Medicare Other | Attending: Cardiovascular Disease

## 2016-06-13 DIAGNOSIS — I351 Nonrheumatic aortic (valve) insufficiency: Secondary | ICD-10-CM | POA: Insufficient documentation

## 2016-06-13 DIAGNOSIS — I517 Cardiomegaly: Secondary | ICD-10-CM | POA: Diagnosis not present

## 2016-06-13 DIAGNOSIS — I059 Rheumatic mitral valve disease, unspecified: Secondary | ICD-10-CM | POA: Insufficient documentation

## 2016-06-13 DIAGNOSIS — I359 Nonrheumatic aortic valve disorder, unspecified: Secondary | ICD-10-CM | POA: Diagnosis present

## 2016-07-25 DIAGNOSIS — M4692 Unspecified inflammatory spondylopathy, cervical region: Secondary | ICD-10-CM | POA: Diagnosis not present

## 2016-09-01 DIAGNOSIS — M40292 Other kyphosis, cervical region: Secondary | ICD-10-CM | POA: Diagnosis not present

## 2016-12-08 DIAGNOSIS — M9903 Segmental and somatic dysfunction of lumbar region: Secondary | ICD-10-CM | POA: Diagnosis not present

## 2016-12-08 DIAGNOSIS — M47816 Spondylosis without myelopathy or radiculopathy, lumbar region: Secondary | ICD-10-CM | POA: Diagnosis not present

## 2016-12-13 DIAGNOSIS — M47816 Spondylosis without myelopathy or radiculopathy, lumbar region: Secondary | ICD-10-CM | POA: Diagnosis not present

## 2016-12-13 DIAGNOSIS — M9903 Segmental and somatic dysfunction of lumbar region: Secondary | ICD-10-CM | POA: Diagnosis not present

## 2016-12-14 DIAGNOSIS — M47816 Spondylosis without myelopathy or radiculopathy, lumbar region: Secondary | ICD-10-CM | POA: Diagnosis not present

## 2016-12-14 DIAGNOSIS — M9903 Segmental and somatic dysfunction of lumbar region: Secondary | ICD-10-CM | POA: Diagnosis not present

## 2016-12-19 DIAGNOSIS — M9903 Segmental and somatic dysfunction of lumbar region: Secondary | ICD-10-CM | POA: Diagnosis not present

## 2016-12-19 DIAGNOSIS — M47816 Spondylosis without myelopathy or radiculopathy, lumbar region: Secondary | ICD-10-CM | POA: Diagnosis not present

## 2016-12-20 DIAGNOSIS — M9903 Segmental and somatic dysfunction of lumbar region: Secondary | ICD-10-CM | POA: Diagnosis not present

## 2016-12-20 DIAGNOSIS — M47816 Spondylosis without myelopathy or radiculopathy, lumbar region: Secondary | ICD-10-CM | POA: Diagnosis not present

## 2016-12-21 DIAGNOSIS — M9903 Segmental and somatic dysfunction of lumbar region: Secondary | ICD-10-CM | POA: Diagnosis not present

## 2016-12-21 DIAGNOSIS — M47816 Spondylosis without myelopathy or radiculopathy, lumbar region: Secondary | ICD-10-CM | POA: Diagnosis not present

## 2016-12-28 DIAGNOSIS — M47816 Spondylosis without myelopathy or radiculopathy, lumbar region: Secondary | ICD-10-CM | POA: Diagnosis not present

## 2016-12-28 DIAGNOSIS — M9903 Segmental and somatic dysfunction of lumbar region: Secondary | ICD-10-CM | POA: Diagnosis not present

## 2017-01-02 DIAGNOSIS — M47816 Spondylosis without myelopathy or radiculopathy, lumbar region: Secondary | ICD-10-CM | POA: Diagnosis not present

## 2017-01-02 DIAGNOSIS — M9903 Segmental and somatic dysfunction of lumbar region: Secondary | ICD-10-CM | POA: Diagnosis not present

## 2017-01-04 DIAGNOSIS — M9903 Segmental and somatic dysfunction of lumbar region: Secondary | ICD-10-CM | POA: Diagnosis not present

## 2017-01-04 DIAGNOSIS — M47816 Spondylosis without myelopathy or radiculopathy, lumbar region: Secondary | ICD-10-CM | POA: Diagnosis not present

## 2017-01-09 DIAGNOSIS — M9903 Segmental and somatic dysfunction of lumbar region: Secondary | ICD-10-CM | POA: Diagnosis not present

## 2017-01-09 DIAGNOSIS — M47816 Spondylosis without myelopathy or radiculopathy, lumbar region: Secondary | ICD-10-CM | POA: Diagnosis not present

## 2017-01-10 DIAGNOSIS — M47816 Spondylosis without myelopathy or radiculopathy, lumbar region: Secondary | ICD-10-CM | POA: Diagnosis not present

## 2017-01-10 DIAGNOSIS — M9903 Segmental and somatic dysfunction of lumbar region: Secondary | ICD-10-CM | POA: Diagnosis not present

## 2017-02-03 IMAGING — CR DG CHEST 2V
2 series · 2 of 2 positions shown · non-contrast
Comparison: None.

CLINICAL DATA: Pt states cough x 3 weeks - never a smoker - pt
states no other chest hx

EXAM:
CHEST  2 VIEW

[w chest lat]
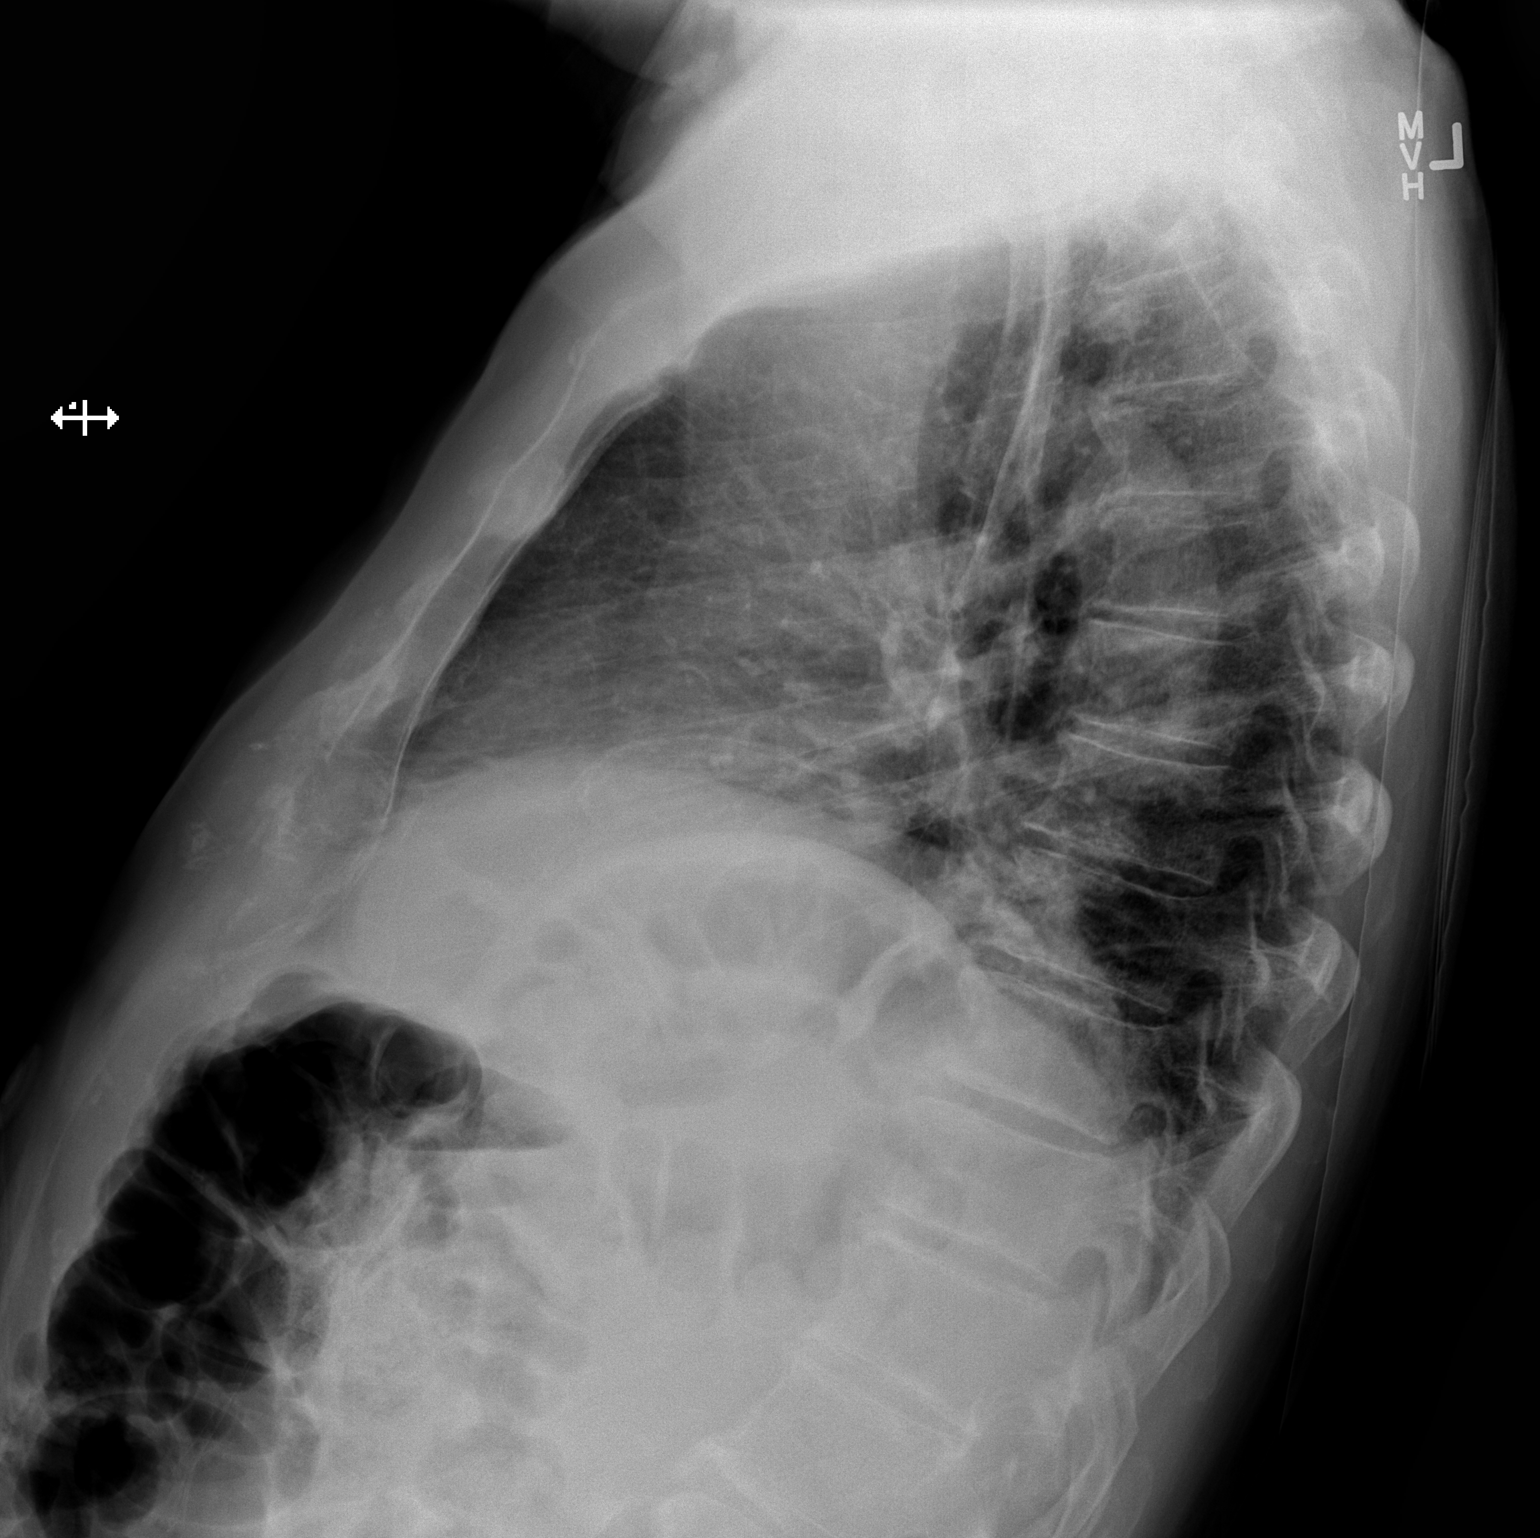

[x chest ap]
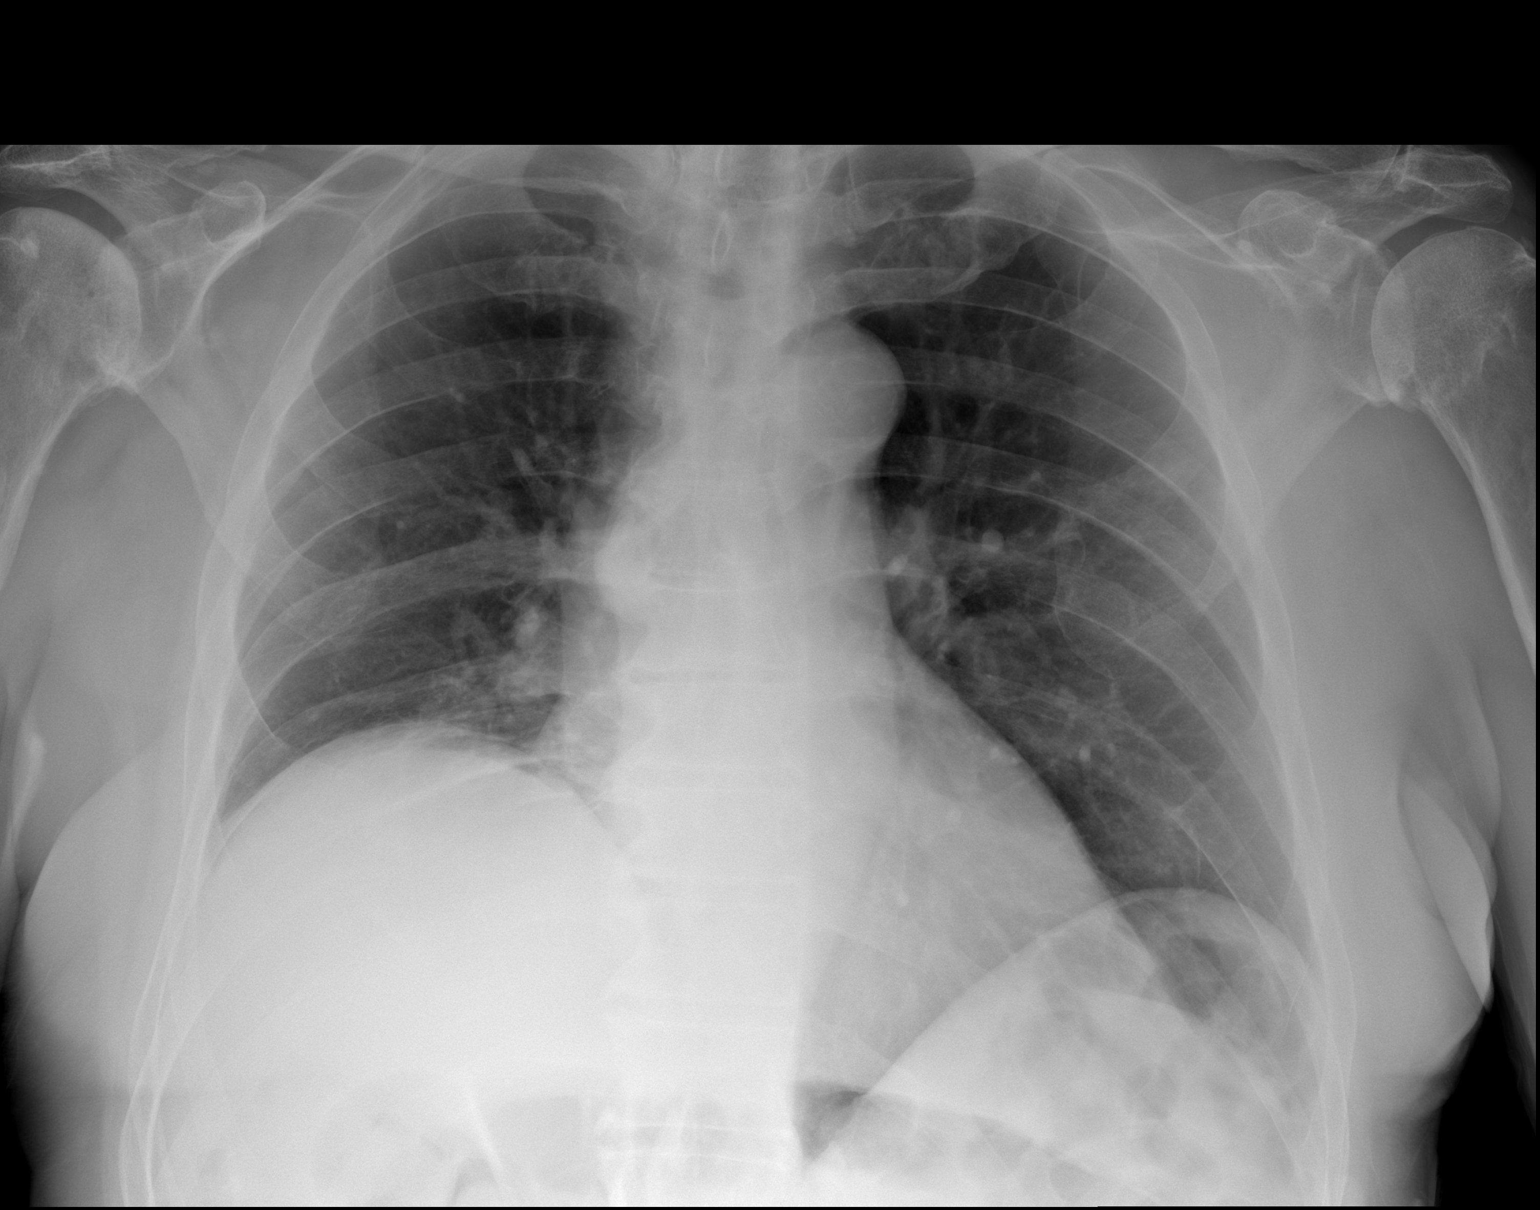

[2 of 2 positions shown; findings below may reference images not displayed]

FINDINGS: Normal mediastinum and cardiac silhouette. Normal pulmonary
vasculature. No evidence of effusion, infiltrate, or pneumothorax.
No acute bony abnormality. Degenerative osteophytosis of the
thoracic spine.
IMPRESSION: No acute cardiopulmonary process.

## 2017-06-02 DIAGNOSIS — G250 Essential tremor: Secondary | ICD-10-CM | POA: Diagnosis not present

## 2017-06-02 DIAGNOSIS — I351 Nonrheumatic aortic (valve) insufficiency: Secondary | ICD-10-CM | POA: Diagnosis not present

## 2017-06-02 DIAGNOSIS — I340 Nonrheumatic mitral (valve) insufficiency: Secondary | ICD-10-CM | POA: Diagnosis not present

## 2017-06-02 DIAGNOSIS — Z0000 Encounter for general adult medical examination without abnormal findings: Secondary | ICD-10-CM | POA: Diagnosis not present

## 2017-06-02 DIAGNOSIS — Z1389 Encounter for screening for other disorder: Secondary | ICD-10-CM | POA: Diagnosis not present

## 2017-06-02 DIAGNOSIS — Z79899 Other long term (current) drug therapy: Secondary | ICD-10-CM | POA: Diagnosis not present

## 2017-06-02 DIAGNOSIS — I872 Venous insufficiency (chronic) (peripheral): Secondary | ICD-10-CM | POA: Diagnosis not present

## 2017-06-05 ENCOUNTER — Telehealth (HOSPITAL_COMMUNITY): Payer: Self-pay | Admitting: Geriatric Medicine

## 2017-06-06 ENCOUNTER — Other Ambulatory Visit: Payer: Self-pay | Admitting: Geriatric Medicine

## 2017-06-06 DIAGNOSIS — I351 Nonrheumatic aortic (valve) insufficiency: Secondary | ICD-10-CM

## 2017-06-06 NOTE — Telephone Encounter (Signed)
User: Trina AoGRIFFIN, Mahari Vankirk A Date/time: 06/05/17 1:00 PM  Comment: Returned Tammy's call and lmsg to CB to scheduled Mr. Henriette CombsJohnson's echo.  Context:  Outcome: Left Message  Phone number: [phone] Phone Type:   Comm. type: Telephone Call type: Outgoing  Contact: Tammy- Dr. Laverle HobbyStoneking's office Relation to patient: Provider

## 2017-06-09 ENCOUNTER — Ambulatory Visit (HOSPITAL_COMMUNITY): Payer: Medicare Other | Attending: Cardiology

## 2017-06-09 ENCOUNTER — Other Ambulatory Visit: Payer: Self-pay

## 2017-06-09 DIAGNOSIS — I351 Nonrheumatic aortic (valve) insufficiency: Secondary | ICD-10-CM | POA: Diagnosis not present

## 2017-06-09 DIAGNOSIS — I080 Rheumatic disorders of both mitral and aortic valves: Secondary | ICD-10-CM | POA: Insufficient documentation

## 2017-06-09 DIAGNOSIS — I5030 Unspecified diastolic (congestive) heart failure: Secondary | ICD-10-CM | POA: Diagnosis not present

## 2017-08-01 DIAGNOSIS — Z23 Encounter for immunization: Secondary | ICD-10-CM | POA: Diagnosis not present

## 2017-10-23 DIAGNOSIS — J069 Acute upper respiratory infection, unspecified: Secondary | ICD-10-CM | POA: Diagnosis not present

## 2018-06-05 DIAGNOSIS — J3089 Other allergic rhinitis: Secondary | ICD-10-CM | POA: Diagnosis not present

## 2018-06-05 DIAGNOSIS — I340 Nonrheumatic mitral (valve) insufficiency: Secondary | ICD-10-CM | POA: Diagnosis not present

## 2018-06-05 DIAGNOSIS — Z0000 Encounter for general adult medical examination without abnormal findings: Secondary | ICD-10-CM | POA: Diagnosis not present

## 2018-06-05 DIAGNOSIS — Z1389 Encounter for screening for other disorder: Secondary | ICD-10-CM | POA: Diagnosis not present

## 2018-06-05 DIAGNOSIS — G250 Essential tremor: Secondary | ICD-10-CM | POA: Diagnosis not present

## 2018-06-05 DIAGNOSIS — I351 Nonrheumatic aortic (valve) insufficiency: Secondary | ICD-10-CM | POA: Diagnosis not present

## 2018-07-05 DIAGNOSIS — J069 Acute upper respiratory infection, unspecified: Secondary | ICD-10-CM | POA: Diagnosis not present

## 2018-07-13 DIAGNOSIS — R05 Cough: Secondary | ICD-10-CM | POA: Diagnosis not present

## 2018-07-13 DIAGNOSIS — K760 Fatty (change of) liver, not elsewhere classified: Secondary | ICD-10-CM | POA: Diagnosis not present

## 2018-07-13 DIAGNOSIS — R112 Nausea with vomiting, unspecified: Secondary | ICD-10-CM | POA: Diagnosis not present

## 2018-07-14 DIAGNOSIS — R05 Cough: Secondary | ICD-10-CM | POA: Diagnosis not present

## 2018-07-14 DIAGNOSIS — K760 Fatty (change of) liver, not elsewhere classified: Secondary | ICD-10-CM | POA: Diagnosis not present

## 2018-08-21 DIAGNOSIS — R509 Fever, unspecified: Secondary | ICD-10-CM | POA: Diagnosis not present

## 2018-08-21 DIAGNOSIS — T50B95A Adverse effect of other viral vaccines, initial encounter: Secondary | ICD-10-CM | POA: Diagnosis not present

## 2018-08-22 DIAGNOSIS — Z23 Encounter for immunization: Secondary | ICD-10-CM | POA: Diagnosis not present

## 2018-10-11 DIAGNOSIS — H6121 Impacted cerumen, right ear: Secondary | ICD-10-CM | POA: Diagnosis not present

## 2018-11-22 DIAGNOSIS — J069 Acute upper respiratory infection, unspecified: Secondary | ICD-10-CM | POA: Diagnosis not present

## 2019-01-01 DIAGNOSIS — G603 Idiopathic progressive neuropathy: Secondary | ICD-10-CM | POA: Diagnosis not present

## 2019-01-01 DIAGNOSIS — R262 Difficulty in walking, not elsewhere classified: Secondary | ICD-10-CM | POA: Diagnosis not present

## 2019-01-02 DIAGNOSIS — G603 Idiopathic progressive neuropathy: Secondary | ICD-10-CM | POA: Diagnosis not present

## 2019-01-02 DIAGNOSIS — R262 Difficulty in walking, not elsewhere classified: Secondary | ICD-10-CM | POA: Diagnosis not present

## 2019-01-07 DIAGNOSIS — R262 Difficulty in walking, not elsewhere classified: Secondary | ICD-10-CM | POA: Diagnosis not present

## 2019-01-07 DIAGNOSIS — G603 Idiopathic progressive neuropathy: Secondary | ICD-10-CM | POA: Diagnosis not present

## 2019-01-09 DIAGNOSIS — R262 Difficulty in walking, not elsewhere classified: Secondary | ICD-10-CM | POA: Diagnosis not present

## 2019-01-09 DIAGNOSIS — G603 Idiopathic progressive neuropathy: Secondary | ICD-10-CM | POA: Diagnosis not present

## 2019-06-19 DIAGNOSIS — Z1389 Encounter for screening for other disorder: Secondary | ICD-10-CM | POA: Diagnosis not present

## 2019-06-19 DIAGNOSIS — I340 Nonrheumatic mitral (valve) insufficiency: Secondary | ICD-10-CM | POA: Diagnosis not present

## 2019-06-19 DIAGNOSIS — G629 Polyneuropathy, unspecified: Secondary | ICD-10-CM | POA: Diagnosis not present

## 2019-06-19 DIAGNOSIS — Z0000 Encounter for general adult medical examination without abnormal findings: Secondary | ICD-10-CM | POA: Diagnosis not present

## 2019-06-19 DIAGNOSIS — R739 Hyperglycemia, unspecified: Secondary | ICD-10-CM | POA: Diagnosis not present

## 2019-06-19 DIAGNOSIS — I351 Nonrheumatic aortic (valve) insufficiency: Secondary | ICD-10-CM | POA: Diagnosis not present

## 2019-06-19 DIAGNOSIS — Z79899 Other long term (current) drug therapy: Secondary | ICD-10-CM | POA: Diagnosis not present

## 2019-06-21 ENCOUNTER — Other Ambulatory Visit (HOSPITAL_COMMUNITY): Payer: Self-pay | Admitting: Geriatric Medicine

## 2019-06-21 DIAGNOSIS — I351 Nonrheumatic aortic (valve) insufficiency: Secondary | ICD-10-CM

## 2019-06-26 ENCOUNTER — Ambulatory Visit (HOSPITAL_COMMUNITY): Payer: Medicare Other | Attending: Cardiovascular Disease

## 2019-06-26 ENCOUNTER — Other Ambulatory Visit: Payer: Self-pay

## 2019-06-26 DIAGNOSIS — I351 Nonrheumatic aortic (valve) insufficiency: Secondary | ICD-10-CM | POA: Insufficient documentation

## 2019-08-06 DIAGNOSIS — Z23 Encounter for immunization: Secondary | ICD-10-CM | POA: Diagnosis not present

## 2019-12-18 DIAGNOSIS — J3089 Other allergic rhinitis: Secondary | ICD-10-CM | POA: Diagnosis not present

## 2019-12-18 DIAGNOSIS — R269 Unspecified abnormalities of gait and mobility: Secondary | ICD-10-CM | POA: Diagnosis not present

## 2019-12-18 DIAGNOSIS — I351 Nonrheumatic aortic (valve) insufficiency: Secondary | ICD-10-CM | POA: Diagnosis not present

## 2019-12-23 DIAGNOSIS — R2681 Unsteadiness on feet: Secondary | ICD-10-CM | POA: Diagnosis not present

## 2019-12-23 DIAGNOSIS — R2689 Other abnormalities of gait and mobility: Secondary | ICD-10-CM | POA: Diagnosis not present

## 2019-12-23 DIAGNOSIS — M6281 Muscle weakness (generalized): Secondary | ICD-10-CM | POA: Diagnosis not present

## 2019-12-23 DIAGNOSIS — R269 Unspecified abnormalities of gait and mobility: Secondary | ICD-10-CM | POA: Diagnosis not present

## 2019-12-23 DIAGNOSIS — G603 Idiopathic progressive neuropathy: Secondary | ICD-10-CM | POA: Diagnosis not present

## 2019-12-26 DIAGNOSIS — R2681 Unsteadiness on feet: Secondary | ICD-10-CM | POA: Diagnosis not present

## 2019-12-26 DIAGNOSIS — R269 Unspecified abnormalities of gait and mobility: Secondary | ICD-10-CM | POA: Diagnosis not present

## 2019-12-26 DIAGNOSIS — M6281 Muscle weakness (generalized): Secondary | ICD-10-CM | POA: Diagnosis not present

## 2019-12-26 DIAGNOSIS — R2689 Other abnormalities of gait and mobility: Secondary | ICD-10-CM | POA: Diagnosis not present

## 2019-12-26 DIAGNOSIS — G603 Idiopathic progressive neuropathy: Secondary | ICD-10-CM | POA: Diagnosis not present

## 2019-12-27 DIAGNOSIS — G603 Idiopathic progressive neuropathy: Secondary | ICD-10-CM | POA: Diagnosis not present

## 2019-12-27 DIAGNOSIS — R2689 Other abnormalities of gait and mobility: Secondary | ICD-10-CM | POA: Diagnosis not present

## 2019-12-27 DIAGNOSIS — R269 Unspecified abnormalities of gait and mobility: Secondary | ICD-10-CM | POA: Diagnosis not present

## 2019-12-27 DIAGNOSIS — R2681 Unsteadiness on feet: Secondary | ICD-10-CM | POA: Diagnosis not present

## 2019-12-27 DIAGNOSIS — M6281 Muscle weakness (generalized): Secondary | ICD-10-CM | POA: Diagnosis not present

## 2020-01-01 DIAGNOSIS — M6281 Muscle weakness (generalized): Secondary | ICD-10-CM | POA: Diagnosis not present

## 2020-01-01 DIAGNOSIS — R269 Unspecified abnormalities of gait and mobility: Secondary | ICD-10-CM | POA: Diagnosis not present

## 2020-01-01 DIAGNOSIS — R2681 Unsteadiness on feet: Secondary | ICD-10-CM | POA: Diagnosis not present

## 2020-01-01 DIAGNOSIS — G603 Idiopathic progressive neuropathy: Secondary | ICD-10-CM | POA: Diagnosis not present

## 2020-01-01 DIAGNOSIS — R2689 Other abnormalities of gait and mobility: Secondary | ICD-10-CM | POA: Diagnosis not present

## 2020-01-02 DIAGNOSIS — R2689 Other abnormalities of gait and mobility: Secondary | ICD-10-CM | POA: Diagnosis not present

## 2020-01-02 DIAGNOSIS — R269 Unspecified abnormalities of gait and mobility: Secondary | ICD-10-CM | POA: Diagnosis not present

## 2020-01-02 DIAGNOSIS — G603 Idiopathic progressive neuropathy: Secondary | ICD-10-CM | POA: Diagnosis not present

## 2020-01-02 DIAGNOSIS — R2681 Unsteadiness on feet: Secondary | ICD-10-CM | POA: Diagnosis not present

## 2020-01-02 DIAGNOSIS — M6281 Muscle weakness (generalized): Secondary | ICD-10-CM | POA: Diagnosis not present

## 2020-01-03 DIAGNOSIS — R2681 Unsteadiness on feet: Secondary | ICD-10-CM | POA: Diagnosis not present

## 2020-01-03 DIAGNOSIS — R269 Unspecified abnormalities of gait and mobility: Secondary | ICD-10-CM | POA: Diagnosis not present

## 2020-01-03 DIAGNOSIS — R2689 Other abnormalities of gait and mobility: Secondary | ICD-10-CM | POA: Diagnosis not present

## 2020-01-03 DIAGNOSIS — M6281 Muscle weakness (generalized): Secondary | ICD-10-CM | POA: Diagnosis not present

## 2020-01-03 DIAGNOSIS — G603 Idiopathic progressive neuropathy: Secondary | ICD-10-CM | POA: Diagnosis not present

## 2020-01-08 DIAGNOSIS — R269 Unspecified abnormalities of gait and mobility: Secondary | ICD-10-CM | POA: Diagnosis not present

## 2020-01-08 DIAGNOSIS — R2689 Other abnormalities of gait and mobility: Secondary | ICD-10-CM | POA: Diagnosis not present

## 2020-01-08 DIAGNOSIS — R2681 Unsteadiness on feet: Secondary | ICD-10-CM | POA: Diagnosis not present

## 2020-01-08 DIAGNOSIS — G603 Idiopathic progressive neuropathy: Secondary | ICD-10-CM | POA: Diagnosis not present

## 2020-01-08 DIAGNOSIS — M6281 Muscle weakness (generalized): Secondary | ICD-10-CM | POA: Diagnosis not present

## 2020-01-09 DIAGNOSIS — R2681 Unsteadiness on feet: Secondary | ICD-10-CM | POA: Diagnosis not present

## 2020-01-09 DIAGNOSIS — R269 Unspecified abnormalities of gait and mobility: Secondary | ICD-10-CM | POA: Diagnosis not present

## 2020-01-09 DIAGNOSIS — G603 Idiopathic progressive neuropathy: Secondary | ICD-10-CM | POA: Diagnosis not present

## 2020-01-09 DIAGNOSIS — M6281 Muscle weakness (generalized): Secondary | ICD-10-CM | POA: Diagnosis not present

## 2020-01-09 DIAGNOSIS — R2689 Other abnormalities of gait and mobility: Secondary | ICD-10-CM | POA: Diagnosis not present

## 2020-01-10 DIAGNOSIS — R2689 Other abnormalities of gait and mobility: Secondary | ICD-10-CM | POA: Diagnosis not present

## 2020-01-10 DIAGNOSIS — G603 Idiopathic progressive neuropathy: Secondary | ICD-10-CM | POA: Diagnosis not present

## 2020-01-10 DIAGNOSIS — R2681 Unsteadiness on feet: Secondary | ICD-10-CM | POA: Diagnosis not present

## 2020-01-10 DIAGNOSIS — M6281 Muscle weakness (generalized): Secondary | ICD-10-CM | POA: Diagnosis not present

## 2020-01-10 DIAGNOSIS — R269 Unspecified abnormalities of gait and mobility: Secondary | ICD-10-CM | POA: Diagnosis not present

## 2020-01-15 DIAGNOSIS — G603 Idiopathic progressive neuropathy: Secondary | ICD-10-CM | POA: Diagnosis not present

## 2020-01-15 DIAGNOSIS — R2689 Other abnormalities of gait and mobility: Secondary | ICD-10-CM | POA: Diagnosis not present

## 2020-01-15 DIAGNOSIS — M6281 Muscle weakness (generalized): Secondary | ICD-10-CM | POA: Diagnosis not present

## 2020-01-15 DIAGNOSIS — R269 Unspecified abnormalities of gait and mobility: Secondary | ICD-10-CM | POA: Diagnosis not present

## 2020-01-15 DIAGNOSIS — R2681 Unsteadiness on feet: Secondary | ICD-10-CM | POA: Diagnosis not present

## 2020-01-16 DIAGNOSIS — R269 Unspecified abnormalities of gait and mobility: Secondary | ICD-10-CM | POA: Diagnosis not present

## 2020-01-16 DIAGNOSIS — M6281 Muscle weakness (generalized): Secondary | ICD-10-CM | POA: Diagnosis not present

## 2020-01-16 DIAGNOSIS — R2681 Unsteadiness on feet: Secondary | ICD-10-CM | POA: Diagnosis not present

## 2020-01-16 DIAGNOSIS — R2689 Other abnormalities of gait and mobility: Secondary | ICD-10-CM | POA: Diagnosis not present

## 2020-01-16 DIAGNOSIS — G603 Idiopathic progressive neuropathy: Secondary | ICD-10-CM | POA: Diagnosis not present

## 2020-01-17 DIAGNOSIS — R2689 Other abnormalities of gait and mobility: Secondary | ICD-10-CM | POA: Diagnosis not present

## 2020-01-17 DIAGNOSIS — M6281 Muscle weakness (generalized): Secondary | ICD-10-CM | POA: Diagnosis not present

## 2020-01-17 DIAGNOSIS — R2681 Unsteadiness on feet: Secondary | ICD-10-CM | POA: Diagnosis not present

## 2020-01-17 DIAGNOSIS — G603 Idiopathic progressive neuropathy: Secondary | ICD-10-CM | POA: Diagnosis not present

## 2020-01-17 DIAGNOSIS — R269 Unspecified abnormalities of gait and mobility: Secondary | ICD-10-CM | POA: Diagnosis not present

## 2020-01-22 DIAGNOSIS — G603 Idiopathic progressive neuropathy: Secondary | ICD-10-CM | POA: Diagnosis not present

## 2020-01-22 DIAGNOSIS — R2689 Other abnormalities of gait and mobility: Secondary | ICD-10-CM | POA: Diagnosis not present

## 2020-01-22 DIAGNOSIS — R2681 Unsteadiness on feet: Secondary | ICD-10-CM | POA: Diagnosis not present

## 2020-01-22 DIAGNOSIS — M6281 Muscle weakness (generalized): Secondary | ICD-10-CM | POA: Diagnosis not present

## 2020-01-22 DIAGNOSIS — R269 Unspecified abnormalities of gait and mobility: Secondary | ICD-10-CM | POA: Diagnosis not present

## 2020-01-23 DIAGNOSIS — R2681 Unsteadiness on feet: Secondary | ICD-10-CM | POA: Diagnosis not present

## 2020-01-23 DIAGNOSIS — G603 Idiopathic progressive neuropathy: Secondary | ICD-10-CM | POA: Diagnosis not present

## 2020-01-23 DIAGNOSIS — R269 Unspecified abnormalities of gait and mobility: Secondary | ICD-10-CM | POA: Diagnosis not present

## 2020-01-23 DIAGNOSIS — R2689 Other abnormalities of gait and mobility: Secondary | ICD-10-CM | POA: Diagnosis not present

## 2020-01-24 DIAGNOSIS — R2681 Unsteadiness on feet: Secondary | ICD-10-CM | POA: Diagnosis not present

## 2020-01-24 DIAGNOSIS — G603 Idiopathic progressive neuropathy: Secondary | ICD-10-CM | POA: Diagnosis not present

## 2020-01-24 DIAGNOSIS — R269 Unspecified abnormalities of gait and mobility: Secondary | ICD-10-CM | POA: Diagnosis not present

## 2020-01-24 DIAGNOSIS — R2689 Other abnormalities of gait and mobility: Secondary | ICD-10-CM | POA: Diagnosis not present

## 2020-01-28 DIAGNOSIS — G603 Idiopathic progressive neuropathy: Secondary | ICD-10-CM | POA: Diagnosis not present

## 2020-01-28 DIAGNOSIS — R269 Unspecified abnormalities of gait and mobility: Secondary | ICD-10-CM | POA: Diagnosis not present

## 2020-01-28 DIAGNOSIS — R2681 Unsteadiness on feet: Secondary | ICD-10-CM | POA: Diagnosis not present

## 2020-01-28 DIAGNOSIS — R2689 Other abnormalities of gait and mobility: Secondary | ICD-10-CM | POA: Diagnosis not present

## 2020-01-29 DIAGNOSIS — R2689 Other abnormalities of gait and mobility: Secondary | ICD-10-CM | POA: Diagnosis not present

## 2020-01-29 DIAGNOSIS — R269 Unspecified abnormalities of gait and mobility: Secondary | ICD-10-CM | POA: Diagnosis not present

## 2020-01-29 DIAGNOSIS — R2681 Unsteadiness on feet: Secondary | ICD-10-CM | POA: Diagnosis not present

## 2020-01-29 DIAGNOSIS — G603 Idiopathic progressive neuropathy: Secondary | ICD-10-CM | POA: Diagnosis not present

## 2020-02-05 DIAGNOSIS — G603 Idiopathic progressive neuropathy: Secondary | ICD-10-CM | POA: Diagnosis not present

## 2020-02-05 DIAGNOSIS — R2681 Unsteadiness on feet: Secondary | ICD-10-CM | POA: Diagnosis not present

## 2020-02-05 DIAGNOSIS — R269 Unspecified abnormalities of gait and mobility: Secondary | ICD-10-CM | POA: Diagnosis not present

## 2020-02-05 DIAGNOSIS — R2689 Other abnormalities of gait and mobility: Secondary | ICD-10-CM | POA: Diagnosis not present

## 2020-02-06 DIAGNOSIS — G603 Idiopathic progressive neuropathy: Secondary | ICD-10-CM | POA: Diagnosis not present

## 2020-02-06 DIAGNOSIS — R269 Unspecified abnormalities of gait and mobility: Secondary | ICD-10-CM | POA: Diagnosis not present

## 2020-02-06 DIAGNOSIS — R2689 Other abnormalities of gait and mobility: Secondary | ICD-10-CM | POA: Diagnosis not present

## 2020-02-06 DIAGNOSIS — R2681 Unsteadiness on feet: Secondary | ICD-10-CM | POA: Diagnosis not present

## 2020-02-07 DIAGNOSIS — R2681 Unsteadiness on feet: Secondary | ICD-10-CM | POA: Diagnosis not present

## 2020-02-07 DIAGNOSIS — R2689 Other abnormalities of gait and mobility: Secondary | ICD-10-CM | POA: Diagnosis not present

## 2020-02-07 DIAGNOSIS — G603 Idiopathic progressive neuropathy: Secondary | ICD-10-CM | POA: Diagnosis not present

## 2020-02-07 DIAGNOSIS — R269 Unspecified abnormalities of gait and mobility: Secondary | ICD-10-CM | POA: Diagnosis not present

## 2020-05-06 DIAGNOSIS — M5442 Lumbago with sciatica, left side: Secondary | ICD-10-CM | POA: Diagnosis not present

## 2020-05-06 DIAGNOSIS — G8929 Other chronic pain: Secondary | ICD-10-CM | POA: Diagnosis not present

## 2020-05-06 DIAGNOSIS — I498 Other specified cardiac arrhythmias: Secondary | ICD-10-CM | POA: Diagnosis not present

## 2020-05-09 ENCOUNTER — Emergency Department (HOSPITAL_COMMUNITY)
Admission: EM | Admit: 2020-05-09 | Discharge: 2020-05-09 | Disposition: A | Discharge status: Home / Self Care | Payer: Medicare Other | Attending: Emergency Medicine | Admitting: Emergency Medicine

## 2020-05-09 ENCOUNTER — Encounter (HOSPITAL_COMMUNITY): Payer: Self-pay

## 2020-05-09 ENCOUNTER — Other Ambulatory Visit: Payer: Self-pay

## 2020-05-09 ENCOUNTER — Emergency Department (HOSPITAL_COMMUNITY): Payer: Medicare Other

## 2020-05-09 ENCOUNTER — Telehealth (HOSPITAL_COMMUNITY): Payer: Self-pay | Admitting: Emergency Medicine

## 2020-05-09 DIAGNOSIS — K449 Diaphragmatic hernia without obstruction or gangrene: Secondary | ICD-10-CM | POA: Diagnosis not present

## 2020-05-09 DIAGNOSIS — N132 Hydronephrosis with renal and ureteral calculous obstruction: Secondary | ICD-10-CM | POA: Diagnosis not present

## 2020-05-09 DIAGNOSIS — R1031 Right lower quadrant pain: Secondary | ICD-10-CM | POA: Diagnosis not present

## 2020-05-09 DIAGNOSIS — R1084 Generalized abdominal pain: Secondary | ICD-10-CM | POA: Diagnosis not present

## 2020-05-09 DIAGNOSIS — K388 Other specified diseases of appendix: Secondary | ICD-10-CM | POA: Diagnosis not present

## 2020-05-09 DIAGNOSIS — N200 Calculus of kidney: Secondary | ICD-10-CM | POA: Insufficient documentation

## 2020-05-09 DIAGNOSIS — I10 Essential (primary) hypertension: Secondary | ICD-10-CM | POA: Diagnosis not present

## 2020-05-09 DIAGNOSIS — Z7951 Long term (current) use of inhaled steroids: Secondary | ICD-10-CM | POA: Diagnosis not present

## 2020-05-09 LAB — COMPREHENSIVE METABOLIC PANEL
ALT: 28 U/L (ref 0–44)
AST: 26 U/L (ref 15–41)
Albumin: 4 g/dL (ref 3.5–5.0)
Alkaline Phosphatase: 76 U/L (ref 38–126)
Anion gap: 10 (ref 5–15)
BUN: 14 mg/dL (ref 8–23)
CO2: 23 mmol/L (ref 22–32)
Calcium: 9.5 mg/dL (ref 8.9–10.3)
Chloride: 104 mmol/L (ref 98–111)
Creatinine, Ser: 0.91 mg/dL (ref 0.61–1.24)
GFR calc Af Amer: >60 mL/min (ref >60)
GFR calc non Af Amer: >60 mL/min (ref >60)
Glucose, Bld: 174 mg/dL — ABNORMAL HIGH (ref 70–99)
Potassium: 4.2 mmol/L (ref 3.5–5.1)
Sodium: 137 mmol/L (ref 135–145)
Total Bilirubin: 2 mg/dL — ABNORMAL HIGH (ref 0.3–1.2)
Total Protein: 6.8 g/dL (ref 6.5–8.1)

## 2020-05-09 LAB — URINALYSIS, ROUTINE W REFLEX MICROSCOPIC
Bacteria, UA: NONE SEEN
Bilirubin Urine: NEGATIVE
Glucose, UA: NEGATIVE mg/dL
Ketones, ur: NEGATIVE mg/dL
Leukocytes,Ua: NEGATIVE
Nitrite: NEGATIVE
Protein, ur: 30 mg/dL — AB
RBC / HPF: >50 RBC/hpf — ABNORMAL HIGH (ref 0–5)
Specific Gravity, Urine: >1.046 — ABNORMAL HIGH (ref 1.005–1.030)
pH: 5 (ref 5.0–8.0)

## 2020-05-09 LAB — CBC WITH DIFFERENTIAL/PLATELET
Abs Immature Granulocytes: 0.01 10*3/uL (ref 0.00–0.07)
Basophils Absolute: 0 10*3/uL (ref 0.0–0.1)
Basophils Relative: 1 %
Eosinophils Absolute: 0.1 10*3/uL (ref 0.0–0.5)
Eosinophils Relative: 2 %
HCT: 50 % (ref 39.0–52.0)
Hemoglobin: 16.1 g/dL (ref 13.0–17.0)
Immature Granulocytes: 0 %
Lymphocytes Relative: 22 %
Lymphs Abs: 1.1 10*3/uL (ref 0.7–4.0)
MCH: 29.9 pg (ref 26.0–34.0)
MCHC: 32.2 g/dL (ref 30.0–36.0)
MCV: 92.9 fL (ref 80.0–100.0)
Monocytes Absolute: 0.4 10*3/uL (ref 0.1–1.0)
Monocytes Relative: 9 %
Neutro Abs: 3.3 10*3/uL (ref 1.7–7.7)
Neutrophils Relative %: 66 %
Platelets: 176 10*3/uL (ref 150–400)
RBC: 5.38 MIL/uL (ref 4.22–5.81)
RDW: 13.2 % (ref 11.5–15.5)
WBC: 5 10*3/uL (ref 4.0–10.5)
nRBC: 0 % (ref 0.0–0.2)

## 2020-05-09 LAB — LIPASE, BLOOD: Lipase: 23 U/L (ref 11–51)

## 2020-05-09 MED ORDER — HYDROCODONE-ACETAMINOPHEN 5-325 MG PO TABS: 1.0000 | ORAL_TABLET | Freq: Four times a day (QID) | ORAL | 0 refills | Status: AC | PRN

## 2020-05-09 MED ORDER — TAMSULOSIN HCL 0.4 MG PO CAPS: 0.4000 mg | ORAL_CAPSULE | Freq: Every day | ORAL | 0 refills | Status: DC

## 2020-05-09 MED ORDER — HYDROCODONE-ACETAMINOPHEN 5-325 MG PO TABS: 1.0000 | ORAL_TABLET | Freq: Four times a day (QID) | ORAL | 0 refills | Status: DC | PRN

## 2020-05-09 MED ORDER — IOHEXOL 300 MG/ML  SOLN
100.0000 mL | Freq: Once | INTRAMUSCULAR | Status: AC | PRN
  Administered 2020-05-09: 100 mL via INTRAVENOUS

## 2020-05-09 MED ORDER — TAMSULOSIN HCL 0.4 MG PO CAPS: 0.4000 mg | ORAL_CAPSULE | Freq: Every day | ORAL | 0 refills | Status: AC

## 2020-05-09 MED ORDER — NYSTATIN 100000 UNIT/GM EX POWD
Freq: Once | CUTANEOUS | Status: AC
Start: 2020-05-09 — End: 2020-05-09
  Filled 2020-05-09: qty 15

## 2020-05-09 NOTE — ED Notes (Signed)
Patient transported to CT 

## 2020-05-09 NOTE — ED Triage Notes (Signed)
Pt bib [facility] from [facility] (wife out of town) with new onset RLQ pain 4-6/10. Denies NVD, normal BM yesterday. VS stable, cbg 187

## 2020-05-09 NOTE — Care Management (Signed)
Patient called to say his ride will be here in 15 minutes

## 2020-05-09 NOTE — Telephone Encounter (Signed)
Patients wife called in stating the medication went to the incorrect pharmacy. Messaged the provider and it was changed to the pharmacy of choice [facility] [address] Reminded the wife that this is Saturday and to mindful of the hours that the pharmacy is open. Marland Kitchen

## 2020-05-09 NOTE — ED Provider Notes (Signed)
[facility] EMERGENCY DEPARTMENT Provider Note   CSN: [id number] Arrival date & time: 05/09/20  0848     History Chief Complaint  Patient presents with   RLQ pain    x4hr    Charles Alvarez is a 86 y.o. male.  The history is provided by the patient. No language interpreter was used.     86 year old male who is hard of hearing, history of neuropathy, presenting via EMS from [facility] for evaluation of abdominal pain.  Patient report this morning he developed pain to his right lower abdomen.  Pain is sharp, was intense, but has since subsided.  Pain was acute in onset, nonradiating.  Pain is not associate with fever or chills no chest pain or shortness of breath no nausea vomiting or diarrhea no dysuria testicular pain or penis pain.  No recent injury.  Appetite has been the same.  Last bowel movement was yesterday.  No specific treatment tried.  Patient initially was worried for potential appendicitis prompting this ER visit.  Initially he rates his pain as 6 out of 10 but now states pain is 1 out of 10.  He has had his Covid vaccination.  Past Medical History:  Diagnosis Date   HOH (hard of hearing)    Neuropathy (HCC)     There are no problems to display for this patient.   Past Surgical History:  Procedure Laterality Date   NECK SURGERY         Family History  Family history unknown: Yes    Social History   Tobacco Use   Smoking status: Never Smoker   Smokeless tobacco: Never Used  Substance Use Topics   Alcohol use: No   Drug use: No    Home Medications Prior to Admission medications   Medication Sig Start Date End Date Taking? Authorizing Provider  albuterol (PROVENTIL HFA;VENTOLIN HFA) 108 (90 Base) MCG/ACT inhaler Inhale 1-2 puffs into the lungs every 4 (four) hours as needed for wheezing or shortness of breath (or cough/chest congestion). 12/02/15   Street, Mercedes, PA-C  benzonatate (TESSALON) 200 MG capsule  Take 1 capsule (200 mg total) by mouth 3 (three) times daily as needed for cough. 12/02/15   Street, Mercedes, PA-C  ondansetron (ZOFRAN ODT) 8 MG disintegrating tablet Take 1 tablet (8 mg total) by mouth every 8 (eight) hours as needed for nausea or vomiting. 12/02/15   Street, [place], PA-C    Allergies    Zyloprim [allopurinol]  Review of Systems   Review of Systems  All other systems reviewed and are negative.   Physical Exam Updated Vital Signs BP (!) 154/63    Pulse 68    Temp 98.4 F (36.9 C) (Oral)    Resp 18    Ht 5\' 8"  (1.727 m)    Wt 83.9 kg    SpO2 97%    BMI 28.13 kg/m   Physical Exam Vitals and nursing note reviewed. Exam conducted with a chaperone present.  Constitutional:      General: He is not in acute distress.    Appearance: He is well-developed.     Comments: Elderly male resting in bed in no acute discomfort.  HENT:     Head: Atraumatic.  Eyes:     Conjunctiva/sclera: Conjunctivae normal.  Cardiovascular:     Rate and Rhythm: Normal rate and regular rhythm.     Pulses: Normal pulses.     Heart sounds: Normal heart sounds.  Pulmonary:  Effort: Pulmonary effort is normal.     Breath sounds: Normal breath sounds.  Abdominal:     General: Abdomen is flat. Bowel sounds are normal.     Palpations: Abdomen is soft.     Tenderness: There is no abdominal tenderness.     Comments: No reproducible abdominal tenderness on exam.  Abdomen is soft and bowel sounds present.  Genitourinary:    Comments: Macerated erythematous skin changes noted to the right inguinal fold consistent with candidiasis.  Testicle nontender, circumcised penis free of lesion or rash.  No hernia or inguinal lymphadenopathy noted. Musculoskeletal:     Cervical back: Neck supple.  Skin:    Findings: No rash.  Neurological:     Mental Status: He is alert. Mental status is at baseline.  Psychiatric:        Mood and Affect: Mood normal.     ED Results / Procedures / Treatments    Labs (all labs ordered are listed, but only abnormal results are displayed) Labs Reviewed  COMPREHENSIVE METABOLIC PANEL - Abnormal; Notable for the following components:      Result Value   Glucose, Bld 174 (*)    Total Bilirubin 2.0 (*)    All other components within normal limits  URINALYSIS, ROUTINE W REFLEX MICROSCOPIC - Abnormal; Notable for the following components:   Color, Urine AMBER (*)    APPearance HAZY (*)    Specific Gravity, Urine >1.046 (*)    Hgb urine dipstick LARGE (*)    Protein, ur 30 (*)    RBC / HPF >50 (*)    All other components within normal limits  CBC WITH DIFFERENTIAL/PLATELET  LIPASE, BLOOD    EKG None  Radiology CT ABDOMEN PELVIS W CONTRAST  Result Date: 05/09/2020 CLINICAL DATA:  Right lower quadrant pain.  Appendicitis suspected. EXAM: CT ABDOMEN AND PELVIS WITH CONTRAST TECHNIQUE: Multidetector CT imaging of the abdomen and pelvis was performed using the standard protocol following bolus administration of intravenous contrast. CONTRAST:  OMNIPAQUE IOHEXOL 300 MG/ML  SOLN COMPARISON:  None. FINDINGS: Lower chest: Right hemidiaphragm elevation with right greater than left base atelectasis/scar. Lingular scarring. Normal heart size without pericardial or pleural effusion. Multivessel coronary artery atherosclerosis. Aortic valve calcification. Hepatobiliary: Normal liver. Normal gallbladder, without biliary ductal dilatation. Pancreas: Normal, without mass or ductal dilatation. Spleen: Normal in size, without focal abnormality. Adrenals/Urinary Tract: Normal adrenal glands. 3-4 mm proximal right ureteric calculus on 55 coronal. This results in mild right-sided hydroureteronephrosis. Left renal collecting system calculi of up to 3 mm. Normal urinary bladder. Stomach/Bowel: Tiny hiatal hernia. Normal colon and terminal ileum. There are appendicoliths on coronal image 47. More distal appendix is normal, including on 67/3. Normal small bowel.  Vascular/Lymphatic: Aortic and branch vessel atherosclerosis. No abdominopelvic adenopathy. Reproductive: Normal prostate. Other: No significant free fluid.  No free intraperitoneal air. 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 Lumbosacral spondylosis with trace L4-5 anterolisthesis IMPRESSION: 1. Mild right-sided hydroureteronephrosis secondary to a proximal right ureteric calculus. 2. Left nephrolithiasis. 3. Appendicoliths, without evidence of acute appendicitis. 4. Tiny hiatal hernia. 5. Aortic Atherosclerosis (ICD10-I70.0). Electronically Signed   By: Jeronimo Greaves M.D.   On: 05/09/2020 10:44    Procedures Procedures (including critical care time)  Medications Ordered in ED Medications  nystatin (MYCOSTATIN/NYSTOP) topical powder ( Topical Given 05/09/20 1203)  iohexol (OMNIPAQUE) 300 MG/ML solution 100 mL (100 mLs Intravenous Contrast Given 05/09/20 1007)    ED Course  I have reviewed the triage vital signs  and the nursing notes.  Pertinent labs & imaging results that were available during my care of the patient were reviewed by me and considered in my medical decision making (see chart for details).    MDM Rules/Calculators/A&P                          BP (!) 162/80    Pulse 63    Temp 98.4 F (36.9 C) (Oral)    Resp 19    Ht 5\' 8"  (1.727 m)    Wt 83.9 kg    SpO2 97%    BMI 28.13 kg/m   Final Clinical Impression(s) / ED Diagnoses Final diagnoses:  Kidney stone on right side    Rx / DC Orders ED Discharge Orders         Ordered    HYDROcodone-acetaminophen (NORCO/VICODIN) 5-325 MG tablet  Every 6 hours PRN     Discontinue  Reprint     05/09/20 1236    tamsulosin (FLOMAX) 0.4 MG CAPS capsule  Daily     Discontinue  Reprint     05/09/20 1236         9:15 AM Patient here with complaints of right lower quadrant abdominal pain that started earlier this morning.  Pain is since subsided.  No reproducible pain on exam.  He does have some evidence of  Candida skin infection to the right inguinal region but does not where his pain is located initially.  Will apply nystatin cream.  Given his age, will obtain labs and will obtain CT scan of the abdomen pelvis for further evaluation.  12:38 PM Pain is due to kidney stone on R side.  Pt d/c with appropriate treatment and referral.  Return precaution given. Care discussed with DR. 05/11/20, PA-C 05/09/20 1238    05/11/20, MD 05/09/20 1534

## 2020-05-09 NOTE — Discharge Instructions (Signed)
You have a 69mm kidney stone on the right side causing pain.  Use a urine strainer each time you urinate to capture the stone and bring it to urology office for further evaluation.  Take pain medication as needed.  Return if you have any concerns.  Be aware pain medication may cause drowsiness.

## 2020-05-09 NOTE — ED Notes (Signed)
Patient verbalizes understanding of discharge instructions. Opportunity for questioning and answers were provided. Armband removed by staff, pt discharged from ED.  

## 2020-05-12 DIAGNOSIS — I872 Venous insufficiency (chronic) (peripheral): Secondary | ICD-10-CM | POA: Diagnosis not present

## 2020-05-12 DIAGNOSIS — M5442 Lumbago with sciatica, left side: Secondary | ICD-10-CM | POA: Diagnosis not present

## 2020-05-12 DIAGNOSIS — I700 Atherosclerosis of aorta: Secondary | ICD-10-CM | POA: Diagnosis not present

## 2020-06-19 DIAGNOSIS — I700 Atherosclerosis of aorta: Secondary | ICD-10-CM | POA: Diagnosis not present

## 2020-06-19 DIAGNOSIS — I340 Nonrheumatic mitral (valve) insufficiency: Secondary | ICD-10-CM | POA: Diagnosis not present

## 2020-06-19 DIAGNOSIS — Z0000 Encounter for general adult medical examination without abnormal findings: Secondary | ICD-10-CM | POA: Diagnosis not present

## 2020-06-19 DIAGNOSIS — Z1389 Encounter for screening for other disorder: Secondary | ICD-10-CM | POA: Diagnosis not present

## 2020-06-19 DIAGNOSIS — I351 Nonrheumatic aortic (valve) insufficiency: Secondary | ICD-10-CM | POA: Diagnosis not present

## 2020-06-24 DIAGNOSIS — I351 Nonrheumatic aortic (valve) insufficiency: Secondary | ICD-10-CM | POA: Diagnosis not present

## 2020-07-31 DIAGNOSIS — Z23 Encounter for immunization: Secondary | ICD-10-CM | POA: Diagnosis not present

## 2021-07-12 IMAGING — CT CT ABD-PELV W/ CM
2 of 5 series · 16 of 46 positions shown, 18 images · IV contrast (APPLIED)
Comparison: None.

CLINICAL DATA: Right lower quadrant pain.  Appendicitis suspected.

EXAM:
CT ABDOMEN AND PELVIS WITH CONTRAST
TECHNIQUE: Multidetector CT imaging of the abdomen and pelvis was performed
using the standard protocol following bolus administration of
intravenous contrast.
CONTRAST:  100mL OMNIPAQUE IOHEXOL 300 MG/ML  SOLN

[Series 3: abdomen 5.0 · axial · 0.83mm/px · z∈[-469,-14]mm · 13 of 107 slices shown, 15 images]
[im 8/107  soft-tissue]
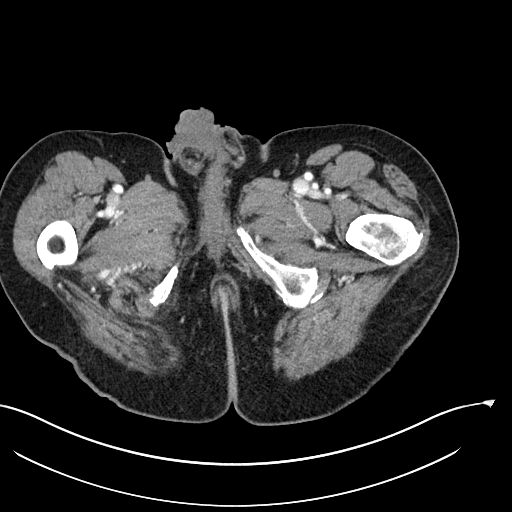
[im 8/107  bone]
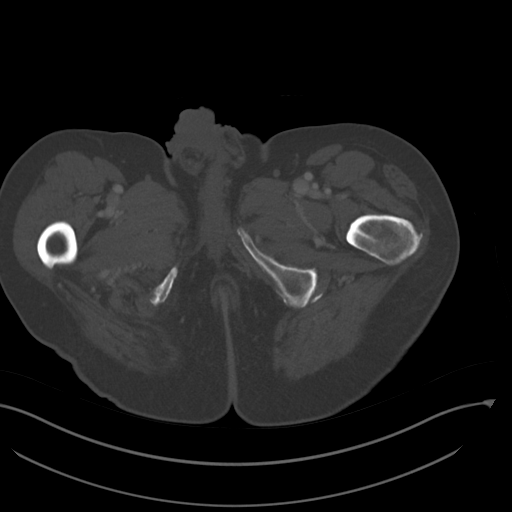
[im 15/107  soft-tissue]
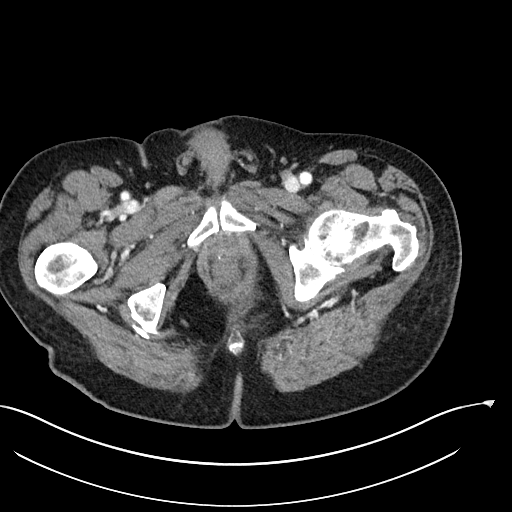
[im 22/107  soft-tissue]
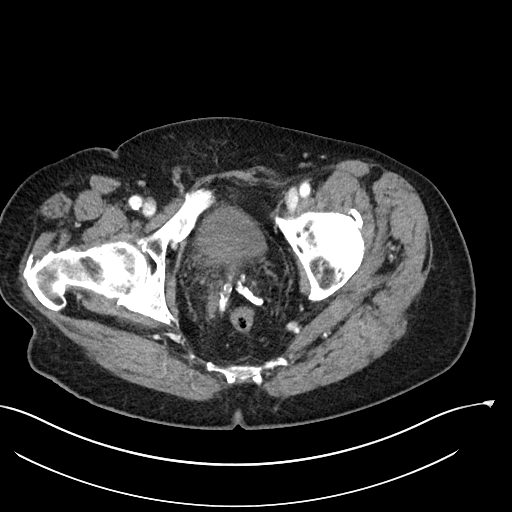
[im 29/107  soft-tissue]
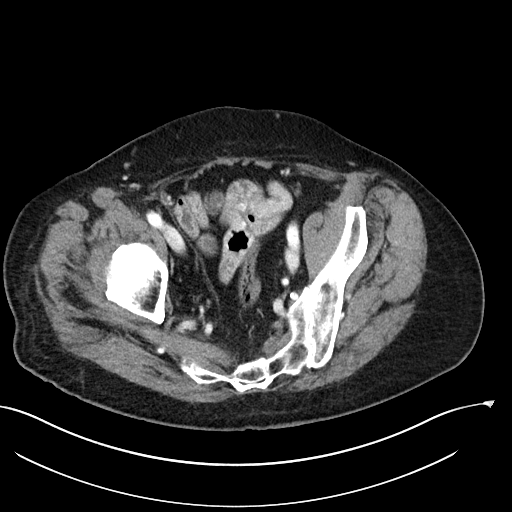
[im 36/107  soft-tissue]
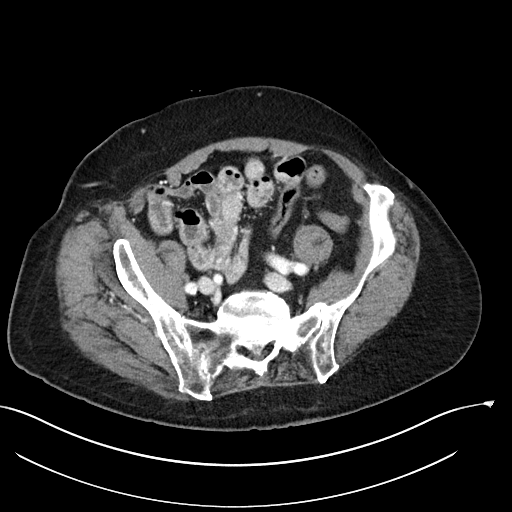
[im 43/107  soft-tissue]
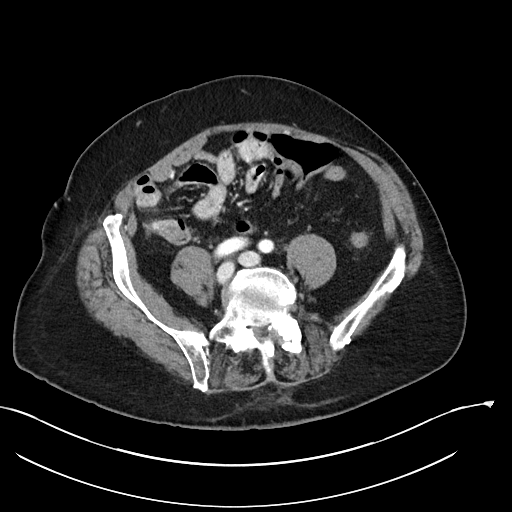
[im 57/107  soft-tissue]
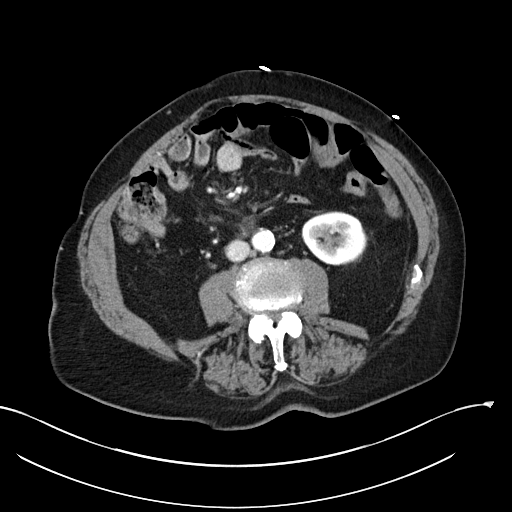
[im 64/107  soft-tissue]
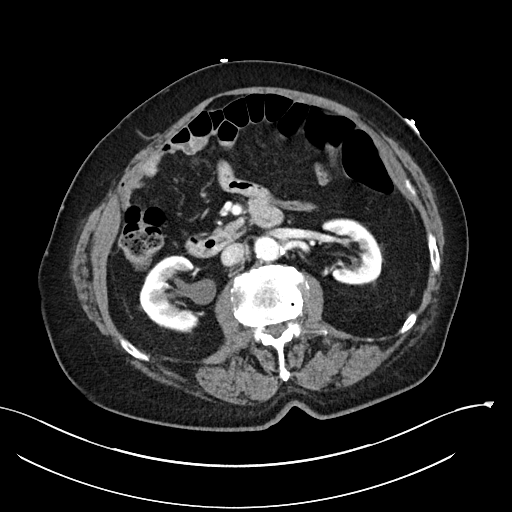
[im 71/107  soft-tissue]
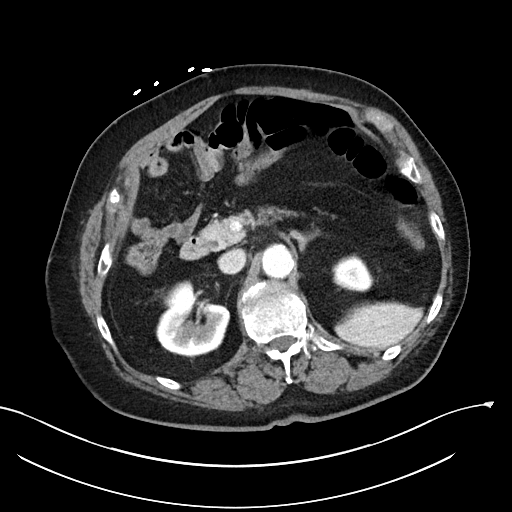
[im 71/107  bone]
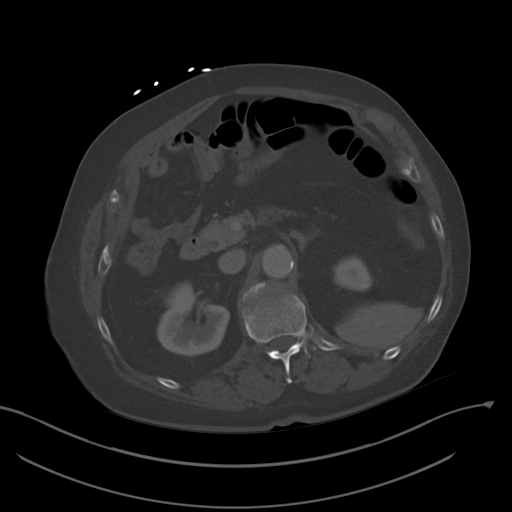
[im 78/107  soft-tissue]
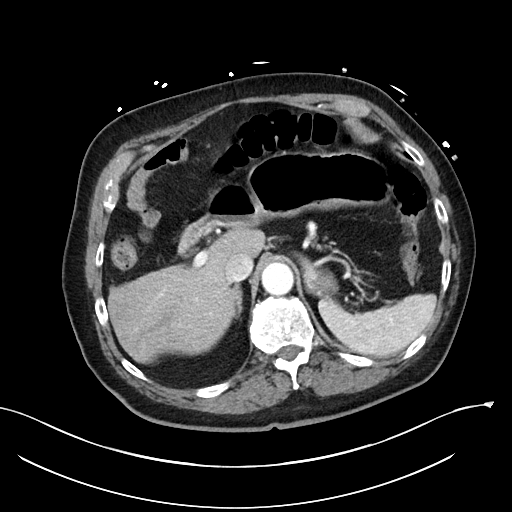
[im 85/107  soft-tissue]
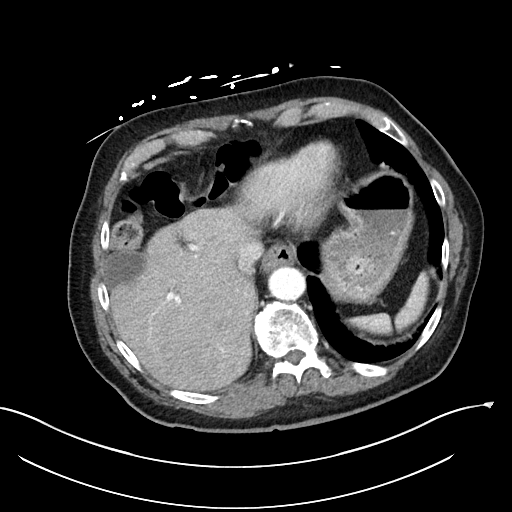
[im 92/107  soft-tissue]
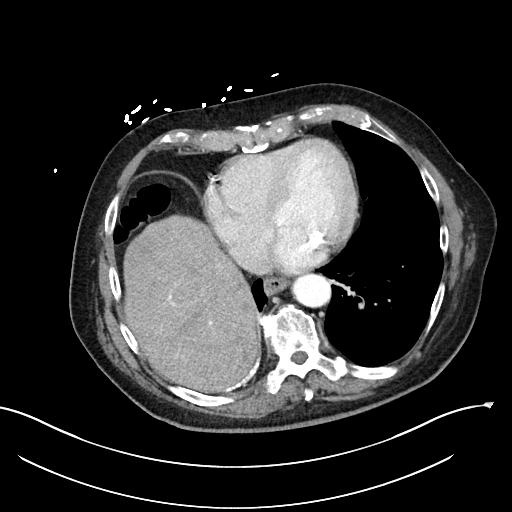
[im 99/107  soft-tissue]
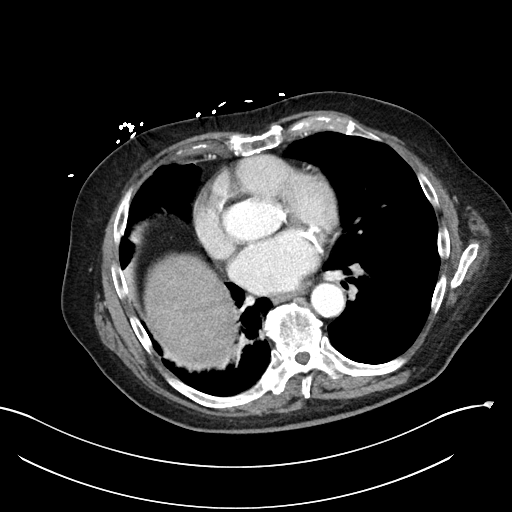

[Series 6: abdomen 3.0 mpr cor · coronal · 0.81mm/px · 3 of 105 slices shown]
[im 35/105  soft-tissue]
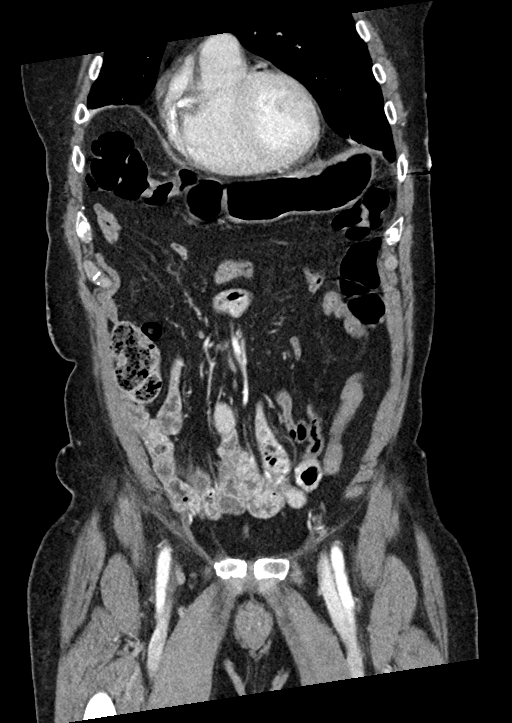
[im 47/105  soft-tissue]
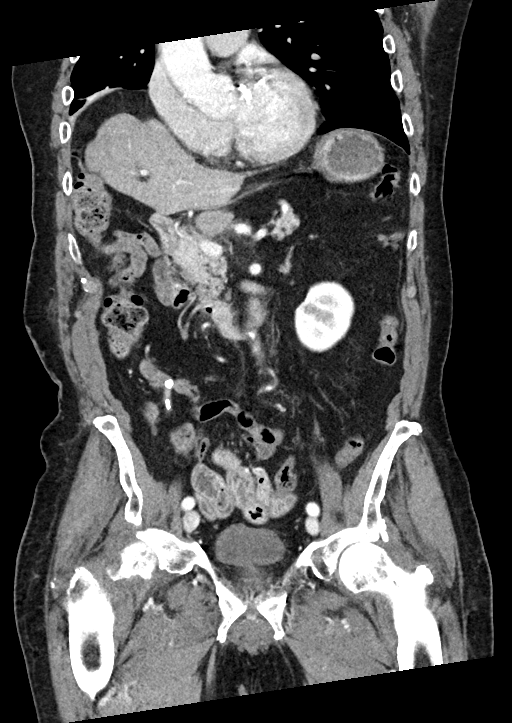
[im 58/105  soft-tissue]
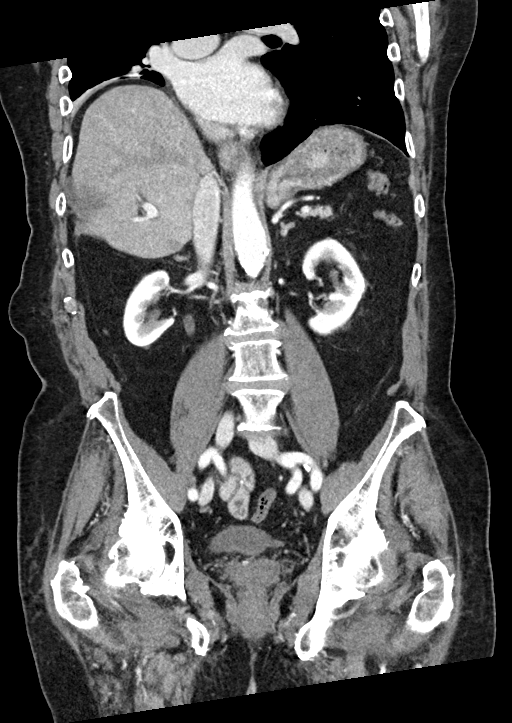

[16 of 46 positions shown; findings below may reference images not displayed]

FINDINGS: Lower chest: Right hemidiaphragm elevation with right greater than
left base atelectasis/scar. Lingular scarring. Normal heart size
without pericardial or pleural effusion. Multivessel coronary artery
atherosclerosis. Aortic valve calcification.

Hepatobiliary: Normal liver. Normal gallbladder, without biliary
ductal dilatation.

Pancreas: Normal, without mass or ductal dilatation.

Spleen: Normal in size, without focal abnormality.

Adrenals/Urinary Tract: Normal adrenal glands. 3-4 mm proximal right
ureteric calculus on 55 coronal. This results in mild right-sided
hydroureteronephrosis.

Left renal collecting system calculi of up to 3 mm. Normal urinary
bladder.

Stomach/Bowel: Tiny hiatal hernia. Normal colon and terminal ileum.
There are appendicoliths on coronal image 47. More distal appendix
is normal, including on 67/3. Normal small bowel.

Vascular/Lymphatic: Aortic and branch vessel atherosclerosis. No
abdominopelvic adenopathy.

Reproductive: Normal prostate.

Other: No significant free fluid.  No free intraperitoneal air.

Musculoskeletal: Degenerative partial fusion of both sacroiliac
joints. Lumbosacral spondylosis with trace L4-5 anterolisthesis
IMPRESSION: 1. Mild right-sided hydroureteronephrosis secondary to a proximal
right ureteric calculus.
2. Left nephrolithiasis.
3. Appendicoliths, without evidence of acute appendicitis.
4. Tiny hiatal hernia.
5. Aortic Atherosclerosis ([id number]).
